# Patient Record
Sex: Male | Born: 1985 | Race: White | Hispanic: No | Marital: Married | Smoking: Never smoker
Health system: Southern US, Community
[De-identification: ages and names within clinical notes are randomized; demographics above are authoritative.]

## PROBLEM LIST (undated history)

## (undated) DIAGNOSIS — K589 Irritable bowel syndrome without diarrhea: Secondary | ICD-10-CM

## (undated) DIAGNOSIS — I10 Essential (primary) hypertension: Secondary | ICD-10-CM

## (undated) HISTORY — DX: Irritable bowel syndrome without diarrhea: K58.9

---
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
## 2017-01-07 HISTORY — PX: COLONOSCOPY: SHX174

## 2018-09-21 ENCOUNTER — Ambulatory Visit: Payer: Managed Care, Other (non HMO) | Admitting: Family Medicine

## 2018-09-21 ENCOUNTER — Other Ambulatory Visit: Payer: Self-pay

## 2018-09-21 ENCOUNTER — Encounter: Payer: Self-pay | Admitting: Family Medicine

## 2018-09-21 VITALS — BP 129/80 | HR 83 | Temp 97.9°F | Resp 18 | Ht 76.5 in | Wt 277.4 lb

## 2018-09-21 DIAGNOSIS — Z0000 Encounter for general adult medical examination without abnormal findings: Secondary | ICD-10-CM | POA: Diagnosis not present

## 2018-09-21 DIAGNOSIS — Z23 Encounter for immunization: Secondary | ICD-10-CM

## 2018-09-21 DIAGNOSIS — E669 Obesity, unspecified: Secondary | ICD-10-CM | POA: Insufficient documentation

## 2018-09-21 DIAGNOSIS — R42 Dizziness and giddiness: Secondary | ICD-10-CM | POA: Insufficient documentation

## 2018-09-21 DIAGNOSIS — Z114 Encounter for screening for human immunodeficiency virus [HIV]: Secondary | ICD-10-CM

## 2018-09-21 DIAGNOSIS — R030 Elevated blood-pressure reading, without diagnosis of hypertension: Secondary | ICD-10-CM | POA: Diagnosis not present

## 2018-09-21 DIAGNOSIS — L749 Eccrine sweat disorder, unspecified: Secondary | ICD-10-CM | POA: Diagnosis not present

## 2018-09-21 NOTE — Progress Notes (Signed)
Patient ID: Erik Anderson, male  DOB: 01-03-1986, 33 y.o.   MRN: [id number] Patient Care Team    Relationship Specialty Notifications Start End  Ma Hillock, DO PCP - General Family Medicine  09/21/18     Chief Complaint  Patient presents with   Establish Care    Pt has had a few episodes recently where pt turned pale, sweating, tachycardic. Went to the NP at work and BP was elevated. 150's/90's. Denies chest pain. Does have frequent headaches. Pt has not seen MD for years and he does not know names. He moved around often.     Subjective:  Erik Anderson is a 33 y.o.  male present for new patient establishment. He desires a CPE and has acute concern.  All past medical history, surgical history, allergies, family history, immunizations, medications and social history were updated in the electronic medical record today. All recent labs, ED visits and hospitalizations within the last year were reviewed.   Elevated BP without diagnosis of hypertension/obesity/Sweating/Dizziness abnormality Patient reports he has had some elevated blood pressures when it is taken at work.  He states he will have episodes where he feels he is pale he starts to sweat and then his heart will start to race.  He was able to take his blood pressure on 1 of these events and its 150/90.  He states he takes his blood pressure on occasions and it is always between 140-150/90.  He states he has an automatic cuff.  He does not have an extra large cuff.  He has a family history of hypertension.  He has never been treated for blood pressure.  He states he does have some anxiety surrounding work but overall he does not feel he has increased anxiety.  There is no family history of arrhythmias.  He states he has had 3-4 events in the past 4 months, all of which have occurred at work.  He routinely skips breakfast and frequently does not eat lunch.  He states these events likely have happened on the days he  have skipped both meals.  He denies any headaches, chest pain, shortness of breath or syncopal episodes.  Health maintenance:  Colonoscopy: No family history of colon cancer.  Routine screening at 72.  Patient reports he had a colonoscopy in 2019 secondary to some IBS symptoms.  He states the colonoscopy was normal and they told him to follow-up for his routine colon cancer screening at 50. Immunizations: Flu Enza vaccine declined today.  Tetanus vaccine due and patient is agreeable to have completed today. Infectious disease screening: Patient agreeable to HIV testing today. PSA: No family history.  No symptoms.  Routine screening.   Body mass index is 33.32 kg/m.  Depression screen [facility] 2/9 09/21/2018  Decreased Interest 0  Down, Depressed, Hopeless 0  PHQ - 2 Score 0   No flowsheet data found.     No flowsheet data found.  Immunization History  Administered Date(s) Administered   Tdap 09/21/2018    No exam data present  Past Medical History:  Diagnosis Date   Hypertension    No Known Allergies History reviewed. No pertinent surgical history. Family History  Problem Relation Age of Onset   Hypertension Mother    Hypertension Brother    Breast cancer Paternal Grandmother    Asthma Paternal Grandfather    Social History   Social History Narrative   Marital status/children/pets: Married   Education/employment: Employed, some college.  Works in  sales.   Safety:      -smoke alarm in the home:Yes     - wears seatbelt: Yes     - Feels safe in their relationships: Yes    Allergies as of 09/21/2018   No Known Allergies     Medication List    as of September 21, 2018 11:59 PM   You have not been prescribed any medications.     All past medical history, surgical history, allergies, family history, immunizations andmedications were updated in the EMR today and reviewed under the history and medication portions of their EMR.    Recent Results (from the past  2160 hour(s))  CBC w/Diff     Status: None   Collection Time: 09/21/18  3:50 PM  Result Value Ref Range   WBC 7.7 4.0 - 10.5 K/uL   RBC 5.03 4.22 - 5.81 Mil/uL   Hemoglobin 14.2 13.0 - 17.0 g/dL   HCT 41.9 39.0 - 52.0 %   MCV 83.3 78.0 - 100.0 fl   MCHC 33.9 30.0 - 36.0 g/dL   RDW 13.3 11.5 - 15.5 %   Platelets 249.0 150.0 - 400.0 K/uL   Neutrophils Relative % 60.1 43.0 - 77.0 %   Lymphocytes Relative 31.1 12.0 - 46.0 %   Monocytes Relative 6.2 3.0 - 12.0 %   Eosinophils Relative 1.2 0.0 - 5.0 %   Basophils Relative 1.4 0.0 - 3.0 %   Neutro Abs 4.6 1.4 - 7.7 K/uL   Lymphs Abs 2.4 0.7 - 4.0 K/uL   Monocytes Absolute 0.5 0.1 - 1.0 K/uL   Eosinophils Absolute 0.1 0.0 - 0.7 K/uL   Basophils Absolute 0.1 0.0 - 0.1 K/uL  Comp Met (CMET)     Status: None   Collection Time: 09/21/18  3:50 PM  Result Value Ref Range   Sodium 139 135 - 145 mEq/L   Potassium 3.9 3.5 - 5.1 mEq/L   Chloride 104 96 - 112 mEq/L   CO2 25 19 - 32 mEq/L   Glucose, Bld 79 70 - 99 mg/dL   BUN 11 6 - 23 mg/dL   Creatinine, Ser 1.04 0.40 - 1.50 mg/dL   Total Bilirubin 0.5 0.2 - 1.2 mg/dL   Alkaline Phosphatase 83 39 - 117 U/L   AST 16 0 - 37 U/L   ALT 19 0 - 53 U/L   Total Protein 6.9 6.0 - 8.3 g/dL   Albumin 4.6 3.5 - 5.2 g/dL   Calcium 9.6 8.4 - 10.5 mg/dL   GFR 81.49 >60.00 mL/min  TSH     Status: None   Collection Time: 09/21/18  3:50 PM  Result Value Ref Range   TSH 1.44 0.35 - 4.50 uIU/mL  Hemoglobin A1c     Status: None   Collection Time: 09/21/18  3:50 PM  Result Value Ref Range   Hgb A1c MFr Bld 5.3 4.6 - 6.5 %    Comment: Glycemic Control Guidelines for People with Diabetes:Non Diabetic:  <6%Goal of Therapy: <7%Additional Action Suggested:  >8%   Lipid panel     Status: Abnormal   Collection Time: 09/21/18  3:50 PM  Result Value Ref Range   Cholesterol 217 (H) 0 - 200 mg/dL    Comment: ATP III Classification       Desirable:  < 200 mg/dL               Borderline High:  200 - 239 mg/dL  High:  > = 240 mg/dL   Triglycerides 205.0 (H) 0.0 - 149.0 mg/dL    Comment: Normal:  <150 mg/dLBorderline High:  150 - 199 mg/dL   HDL 35.80 (L) >39.00 mg/dL   VLDL 41.0 (H) 0.0 - 40.0 mg/dL   Total CHOL/HDL Ratio 6     Comment:                Men          Women1/2 Average Risk     3.4          3.3Average Risk          5.0          4.42X Average Risk          9.6          7.13X Average Risk          15.0          11.0                       NonHDL 181.35     Comment: NOTE:  Non-HDL goal should be 30 mg/dL higher than patient's LDL goal (i.e. LDL goal of < 70 mg/dL, would have non-HDL goal of < 100 mg/dL)  HIV antibody (with reflex)     Status: None   Collection Time: 09/21/18  3:50 PM  Result Value Ref Range   HIV 1&2 Ab, 4th Generation NON-REACTIVE NON-REACTI    Comment: HIV-1 antigen and HIV-1/HIV-2 antibodies were not detected. There is no laboratory evidence of HIV infection. Marland Kitchen PLEASE NOTE: This information has been disclosed to you from records whose confidentiality may be protected by state law.  If your state requires such protection, then the state law prohibits you from making any further disclosure of the information without the specific written consent of the person to whom it pertains, or as otherwise permitted by law. A general authorization for the release of medical or other information is NOT sufficient for this purpose. . For additional information please refer to http://education.questdiagnostics.com/faq/FAQ106 (This link is being provided for informational/ educational purposes only.) . Marland Kitchen The performance of this assay has not been clinically validated in patients less than 5 years old. Marland Kitchen   LDL cholesterol, direct     Status: None   Collection Time: 09/21/18  3:50 PM  Result Value Ref Range   Direct LDL 165.0 mg/dL    Comment: Optimal:  <100 mg/dLNear or Above Optimal:  100-129 mg/dLBorderline High:  130-159 mg/dLHigh:  160-189 mg/dLVery High:  >190 mg/dL      Patient was never admitted.   ROS: 14 pt review of systems performed and negative (unless mentioned in an HPI)  Objective: BP 129/80 (BP Location: Left Arm, Patient Position: Sitting, Cuff Size: Normal)    Pulse 83    Temp 97.9 F (36.6 C) (Temporal)    Resp 18    Ht 6' 4.5" (1.943 m)    Wt 277 lb 6 oz (125.8 kg)    SpO2 99%    BMI 33.32 kg/m  Gen: Afebrile. No acute distress. Nontoxic in appearance, well-developed, well-nourished, pleasant Caucasian obese male. HENT: AT. [place]. Bilateral TM visualized and normal in appearance, normal external auditory canal. MMM, no oral lesions, adequate dentition. Bilateral nares within normal limits. Throat without erythema, ulcerations or exudates.  No cough on exam, no hoarseness on exam. Eyes:Pupils Equal Round Reactive to light, Extraocular movements intact,  Conjunctiva without redness, discharge or  icterus. Neck/lymp/endocrine: Supple, no lymphadenopathy, no thyromegaly CV: RRR no murmur, no edema, +2/4 P posterior tibialis pulses.  Chest: CTAB, no wheeze, rhonchi or crackles.  Normal respiratory effort.  Good air movement. Abd: Soft.  Obese. NTND. BS present.  No masses palpated. No hepatosplenomegaly. No rebound tenderness or guarding. Skin: No rashes, purpura or petechiae. Warm and well-perfused. Skin intact. Neuro/Msk:  Normal gait. PERLA. EOMi. Alert. Oriented x3.  Cranial nerves II through XII intact. Muscle strength 5/5 upper/lower extremity. DTRs equal bilaterally. Psych: Normal affect, dress and demeanor. Normal speech. Normal thought content and judgment.  Assessment/plan: Erik Anderson is a 33 y.o. male present for Est care/CPE and acute concern Elevated BP without diagnosis of hypertension/obesity/Sweating/Dizziness abnormality Blood pressure today is stable.  Discussed with him the potential diagnoses and it seems that he does skip breakfast and lunch many days and the symptoms could be associated with hypoglycemic events.   We will collect CBC, CMP, thyroid, A1c and his lipid panel today.  Advised him to start eating at least a small meal in the morning and have healthy snacks available if skipping meals.   - CBC w/Diff - Comp Met (CMET) - TSH - Hemoglobin A1c - Lipid panel If labs are normal and symptoms resolve with routine meals/not skipping meals no need to follow-up.  If symptoms still occur after ruling out hypoglycemia as cause or he notices elevated blood pressures despite accurate readings (sitting, at least 2 minutes, extra-large cuff) then follow up at that time.  Encounter for screening for HIV - HIV antibody (with reflex) Need for Tdap vaccination - Tdap vaccine greater than or equal to 34yo IM Encounter for preventive health examination Patient was encouraged to exercise greater than 150 minutes a week. Patient was encouraged to choose a diet filled with fresh fruits and vegetables, and lean meats. AVS provided to patient today for education/recommendation on gender specific health and safety maintenance. Colonoscopy: No family history of colon cancer.  Routine screening at 40. Immunizations: Flu Enza vaccine declined today.  Tetanus vaccine due and patient is agreeable to have completed today. Infectious disease screening: Patient agreeable to HIV testing today. PSA: No family history.  No symptoms.  Routine screening.   Return in about 1 year (around 09/21/2019) for CPE (30 min).  Relative annual physical was completed today as well as an additional 10 minutes spent evaluating and discussing acute concern.  Note is dictated utilizing voice recognition software. Although note has been proof read prior to signing, occasional typographical errors still can be missed. If any questions arise, please do not hesitate to call for verification.  Electronically signed by: Howard Pouch, DO [place]

## 2018-09-22 ENCOUNTER — Encounter: Payer: Self-pay | Admitting: Family Medicine

## 2018-09-22 DIAGNOSIS — E785 Hyperlipidemia, unspecified: Secondary | ICD-10-CM | POA: Insufficient documentation

## 2018-09-22 LAB — COMPREHENSIVE METABOLIC PANEL
ALT: 19 U/L (ref 0–53)
AST: 16 U/L (ref 0–37)
Albumin: 4.6 g/dL (ref 3.5–5.2)
Alkaline Phosphatase: 83 U/L (ref 39–117)
BUN: 11 mg/dL (ref 6–23)
CO2: 25 mEq/L (ref 19–32)
Calcium: 9.6 mg/dL (ref 8.4–10.5)
Chloride: 104 mEq/L (ref 96–112)
Creatinine, Ser: 1.04 mg/dL (ref 0.40–1.50)
GFR: 81.49 mL/min (ref >60.00)
Glucose, Bld: 79 mg/dL (ref 70–99)
Potassium: 3.9 mEq/L (ref 3.5–5.1)
Sodium: 139 mEq/L (ref 135–145)
Total Bilirubin: 0.5 mg/dL (ref 0.2–1.2)
Total Protein: 6.9 g/dL (ref 6.0–8.3)

## 2018-09-22 LAB — LIPID PANEL
Cholesterol: 217 mg/dL — ABNORMAL HIGH (ref 0–200)
HDL: 35.8 mg/dL — ABNORMAL LOW (ref >39.00)
NonHDL: 181.35
Total CHOL/HDL Ratio: 6
Triglycerides: 205 mg/dL — ABNORMAL HIGH (ref 0.0–149.0)
VLDL: 41 mg/dL — ABNORMAL HIGH (ref 0.0–40.0)

## 2018-09-22 LAB — CBC WITH DIFFERENTIAL/PLATELET
Basophils Absolute: 0.1 10*3/uL (ref 0.0–0.1)
Basophils Relative: 1.4 % (ref 0.0–3.0)
Eosinophils Absolute: 0.1 10*3/uL (ref 0.0–0.7)
Eosinophils Relative: 1.2 % (ref 0.0–5.0)
HCT: 41.9 % (ref 39.0–52.0)
Hemoglobin: 14.2 g/dL (ref 13.0–17.0)
Lymphocytes Relative: 31.1 % (ref 12.0–46.0)
Lymphs Abs: 2.4 10*3/uL (ref 0.7–4.0)
MCHC: 33.9 g/dL (ref 30.0–36.0)
MCV: 83.3 fl (ref 78.0–100.0)
Monocytes Absolute: 0.5 10*3/uL (ref 0.1–1.0)
Monocytes Relative: 6.2 % (ref 3.0–12.0)
Neutro Abs: 4.6 10*3/uL (ref 1.4–7.7)
Neutrophils Relative %: 60.1 % (ref 43.0–77.0)
Platelets: 249 10*3/uL (ref 150.0–400.0)
RBC: 5.03 Mil/uL (ref 4.22–5.81)
RDW: 13.3 % (ref 11.5–15.5)
WBC: 7.7 10*3/uL (ref 4.0–10.5)

## 2018-09-22 LAB — HIV ANTIBODY (ROUTINE TESTING W REFLEX): HIV 1&2 Ab, 4th Generation: NONREACTIVE

## 2018-09-22 LAB — HEMOGLOBIN A1C: Hgb A1c MFr Bld: 5.3 % (ref 4.6–6.5)

## 2018-09-22 LAB — TSH: TSH: 1.44 u[IU]/mL (ref 0.35–4.50)

## 2018-09-22 LAB — LDL CHOLESTEROL, DIRECT: Direct LDL: 165 mg/dL

## 2018-09-22 NOTE — Patient Instructions (Signed)

## 2019-03-09 ENCOUNTER — Ambulatory Visit: Payer: Managed Care, Other (non HMO) | Admitting: Family Medicine

## 2019-10-26 ENCOUNTER — Telehealth: Payer: Self-pay

## 2019-10-26 NOTE — Telephone Encounter (Signed)
Pt scheduled with PCP on 11/01/19.  Attempted to contact patient to discuss symptoms, no answer/VM.   [facility] Day - Client TELEPHONE ADVICE RECORD AccessNurse Patient Name: Erik Anderson Gender: Male DOB: April 26, 1985 Age: 34 Y 7 M 16 D Return Phone Number: [phone] (Primary) Address: City/State/Zip: [place] [address] Client [facility] Day - Client Client Site  [facility] [place] - Day Physician AA - PHYSICIAN, Erik Anderson- MD Contact Type Call Who Is Calling Patient / Member / Family / Caregiver Call Type Triage / Clinical Relationship To Patient Self Return Phone Number [phone] (Primary) Chief Complaint Flank Pain Reason for Call Symptomatic / Request for Health Information Initial Comment Past two nights been having pain up behind his ribs going from the front around to his back making him vomit. Thinks it is his gall bladder GOTO Facility Not Barbaraann Rondo priority care Translation No Nurse Assessment Nurse: Erik Gelinas, RN, Regulatory affairs officer (Eastern Time): 10/25/2019 3:03:25 PM Confirm and document reason for call. If symptomatic, describe symptoms. ---Past two nights pt has been having side pain from abdomen around to his back with vomiting. had fever saturday and last night around 101. Does the patient have any new or worsening symptoms? ---Yes Will a triage be completed? ---Yes Related visit to physician within the last 2 weeks? ---No Does the PT have any chronic conditions? (i.e. diabetes, asthma, this includes High risk factors for pregnancy, etc.) ---No Is this a behavioral health or substance abuse call? ---No Guidelines Guideline Title Affirmed Question Affirmed Notes Nurse Date/Time (Eastern Time) Flank Pain Patient sounds very sick or weak to the triager Erik Gelinas, RN, [facility] 10/25/2019 3:02:34 PM Disp. Time Erik Anderson Time) Disposition Final User 10/25/2019 3:08:38 PM Go to ED Now (or PCP triage)  Yes Erik Gelinas, RN, Amber Caller Disagree/Comply Comply Caller Understands Yes PreDisposition Did not know what to do PLEASE NOTE: All timestamps contained within this report are represented as [place] Standard Time. CONFIDENTIALTY NOTICE: This fax transmission is intended only for the addressee. It contains information that is legally privileged, confidential or otherwise protected from use or disclosure. If you are not the intended recipient, you are strictly prohibited from reviewing, disclosing, copying using or disseminating any of this information or taking any action in reliance on or regarding this information. If you have received this fax in error, please notify us immediately by telephone so that we can arrange for its return to [place]. Phone: [phone], Toll-Free: [phone], Fax: [phone] Page: 2 of 2 Call Id: [id number] Care Advice Given Per Guideline GO TO ED NOW (OR PCP TRIAGE): ANOTHER ADULT SHOULD DRIVE: * It is better and safer if another adult drives instead of you. CARE ADVICE given per Flank Pain (Adult) guideline. Comments User: Erik Simas, RN Date/Time Erik Anderson Time): 10/25/2019 3:06:36 PM seems to only happen at night. User: Erik Simas, RN Date/Time Erik Anderson Time): 10/25/2019 3:07:16 PM pt not having vomiting today Referrals GO TO FACILITY OTHER - SPECIFY

## 2019-11-01 ENCOUNTER — Encounter: Payer: Self-pay | Admitting: Family Medicine

## 2019-11-01 ENCOUNTER — Ambulatory Visit (INDEPENDENT_AMBULATORY_CARE_PROVIDER_SITE_OTHER): Payer: 59 | Admitting: Family Medicine

## 2019-11-01 ENCOUNTER — Other Ambulatory Visit: Payer: Self-pay

## 2019-11-01 VITALS — BP 138/76 | HR 68 | Temp 98.9°F | Ht 76.5 in | Wt 279.0 lb

## 2019-11-01 DIAGNOSIS — Z23 Encounter for immunization: Secondary | ICD-10-CM | POA: Diagnosis not present

## 2019-11-01 DIAGNOSIS — R1011 Right upper quadrant pain: Secondary | ICD-10-CM | POA: Diagnosis not present

## 2019-11-01 LAB — LIPASE: Lipase: 22 U/L (ref 11.0–59.0)

## 2019-11-01 LAB — COMPREHENSIVE METABOLIC PANEL
ALT: 25 U/L (ref 0–53)
AST: 25 U/L (ref 0–37)
Albumin: 4.5 g/dL (ref 3.5–5.2)
Alkaline Phosphatase: 85 U/L (ref 39–117)
BUN: 13 mg/dL (ref 6–23)
CO2: 28 mEq/L (ref 19–32)
Calcium: 9.3 mg/dL (ref 8.4–10.5)
Chloride: 101 mEq/L (ref 96–112)
Creatinine, Ser: 1.06 mg/dL (ref 0.40–1.50)
GFR: 90.84 mL/min (ref >60.00)
Glucose, Bld: 82 mg/dL (ref 70–99)
Potassium: 3.9 mEq/L (ref 3.5–5.1)
Sodium: 138 mEq/L (ref 135–145)
Total Bilirubin: 0.9 mg/dL (ref 0.2–1.2)
Total Protein: 6.6 g/dL (ref 6.0–8.3)

## 2019-11-01 LAB — CBC
HCT: 41.9 % (ref 39.0–52.0)
Hemoglobin: 14.3 g/dL (ref 13.0–17.0)
MCHC: 34.1 g/dL (ref 30.0–36.0)
MCV: 83.8 fl (ref 78.0–100.0)
Platelets: 217 10*3/uL (ref 150.0–400.0)
RBC: 4.99 Mil/uL (ref 4.22–5.81)
RDW: 13.4 % (ref 11.5–15.5)
WBC: 6.3 10*3/uL (ref 4.0–10.5)

## 2019-11-01 LAB — C-REACTIVE PROTEIN: CRP: <1 mg/dL (ref 0.5–20.0)

## 2019-11-01 NOTE — Progress Notes (Signed)
This visit occurred during the SARS-CoV-2 public health emergency.  Safety protocols were in place, including screening questions prior to the visit, additional usage of staff PPE, and extensive cleaning of exam room while observing appropriate contact time as indicated for disinfecting solutions.    Erik Anderson , May 09, 1985, 34 y.o., male MRN: [id number] Patient Care Team    Relationship Specialty Notifications Start End  Ma Hillock, DO PCP - General Family Medicine  09/21/18     Chief Complaint  Patient presents with  . Abdominal Pain    pt c/o RUQ abd pain that radiates to back for 3 years intermittently but worsen 10 days ago; pt has vomit in past but not recently;      Subjective: Pt presents for an OV with complaints of pain he associated with his gallbladder.  He state he has had RUQ pain intermittently over the last 3 years than can be associated with vomit when it occurs. 10/24/2019 his symptoms became worse. He reports carrying a low-grade fever of 101F, severe pain that radiated from the front to his back with vomiting. This occurrence lasted for approximately 4 hours and he reports he felt like he was "dying. "He reports this is the longest his pain has ever lasted. He then had a smaller but reoccurrence of a gallbladder attack 2 days later. He states his mother had to have her gallbladder removed. He does not feel it is associated with any types of meals or meals at all. He reports the attacks can be very random. Pt has a h/o of IBS. He has had a colonoscopy 2019 for IBS symptoms and reported normal.   Depression screen PHQ 2/9 09/21/2018  Decreased Interest 0  Down, Depressed, Hopeless 0  PHQ - 2 Score 0    No Known Allergies Social History   Social History Narrative   Marital status/children/pets: Married   Education/employment: Employed, some college.  Works in Press [employer].   Safety:      -smoke alarm in the home:Yes     - wears seatbelt: Yes     -  Feels safe in their relationships: Yes   Past Medical History:  Diagnosis Date  . Hypertension   . IBS (irritable bowel syndrome)    Colonoscopy completed 2019 for IBS symptoms   Past Surgical History:  Procedure Laterality Date  . COLONOSCOPY  2019   Patient reports normal.  Completed for IBS symptoms.   Family History  Problem Relation Age of Onset  . Hypertension Mother   . Hypertension Brother   . Breast cancer Paternal Grandmother   . Asthma Paternal Grandfather    Allergies as of 11/01/2019   No Known Allergies     Medication List    as of November 01, 2019 11:37 AM   You have not been prescribed any medications.     All past medical history, surgical history, allergies, family history, immunizations andmedications were updated in the EMR today and reviewed under the history and medication portions of their EMR.     ROS: Negative, with the exception of above mentioned in HPI   Objective:  BP 138/76   Pulse 68   Temp 98.9 F (37.2 C) (Oral)   Ht 6' 4.5" (1.943 m)   Wt 279 lb (126.6 kg)   SpO2 100%   BMI 33.52 kg/m  Body mass index is 33.52 kg/m. Gen: Afebrile. No acute distress. Nontoxic in appearance, well developed, well nourished.  HENT: AT. [place].  Eyes:Pupils Equal Round Reactive to light, Extraocular movements intact,  Conjunctiva without redness, discharge or icterus. CV: RRR  Chest: CTAB, no wheeze or crackles. Good air movement, normal resp effort.  Abd: Soft. Obese. NTND. BS present. No masses palpated. No rebound or guarding. Negative Murphy's. Skin: No rashes, purpura or petechiae.  Neuro:  Normal gait. PERLA. EOMi. Alert. Oriented x3  Psych: Normal affect, dress and demeanor. Normal speech. Normal thought content and judgment.  No exam data present No results found. No results found for this or any previous visit (from the past 24 hour(s)).  Assessment/Plan: Tyronn Golda is a 34 y.o. male present for OV for  Right upper quadrant  abdominal pain Patient's HPI certainly sounds consistent with right upper quadrant pain secondary to gallbladder dysfunction. We discussed cholecystitis versus choledocholithiasis. His attacks seem to be more random in nature. We will start work-up today including ultrasound. - CBC - Comp Met (CMET) - C-reactive protein - Lipase - US Abdomen Complete; Future If ultrasound shows evidence of gallstones would refer to surgery. If gallstones are present we will refer to gastroenterology for further evaluation of gallbladder function.  Need for influenza vaccination Influenza vaccination provided today.   Reviewed expectations re: course of current medical issues.  Discussed self-management of symptoms.  Outlined signs and symptoms indicating need for more acute intervention.  Patient verbalized understanding and all questions were answered.  Patient received an After-Visit Summary.    Orders Placed This Encounter  Procedures  . Flu Vaccine QUAD 6+ mos PF IM (Fluarix Quad PF)   No orders of the defined types were placed in this encounter.  Referral Orders  No referral(s) requested today     Note is dictated utilizing voice recognition software. Although note has been proof read prior to signing, occasional typographical errors still can be missed. If any questions arise, please do not hesitate to call for verification.   electronically signed by:  Howard Pouch, DO  [place]

## 2019-11-01 NOTE — Patient Instructions (Signed)
Cholecystitis  Cholecystitis is irritation and swelling (inflammation) of the gallbladder. The gallbladder is an organ that is shaped like a pear. It is under the liver on the right side of the body. This organ stores bile. Bile helps the body break down (digest) the fats in food. This condition can occur all of a sudden. It needs to be treated. What are the causes? This condition may be caused by stones or lumps that form in the gallbladder (gallstones). Gallstones can block the tube (duct) that carries bile out of your gallbladder. Other causes are:  Damage to the gallbladder due to less blood flow.  Germs in the bile ducts.  Scars or kinks in the bile ducts.  Abnormal growths (tumors) in the liver, pancreas, or gallbladder. What increases the risk? You are more likely to develop this condition if:  You have sickle cell disease.  You take birth control pills.  You use estrogen.  You have alcoholic liver disease.  You have liver cirrhosis.  You are being fed through a vein.  You are very ill.  You do not eat or drink for a long time. This is also called "fasting."  You are overweight (obese).  You lose weight too fast.  You are pregnant.  You have high levels of fat in the blood (triglycerides).  You have irritation and swelling of the pancreas (pancreatitis). What are the signs or symptoms? Symptoms of this condition include:  Pain in the belly (abdomen). Pain is often in the upper right area of the belly.  Tenderness or bloating in the belly.  Feeling sick to your stomach (nauseous).  Throwing up (vomiting).  Fever.  Chills. How is this diagnosed? This condition may be diagnosed with a medical history and exam. You may also have other tests, such as:  Imaging tests. This may include: ? Ultrasound. ? CT scan of the belly. ? Nuclear scan. This is also called a HIDA scan. This scan lets your doctor see the bile as it moves in your body. ? MRI.  Blood  tests. These are done to check: ? Your blood count. The white blood cell count may be higher than normal. ? How well your liver works. How is this treated? This condition may be treated with:  Surgery to take out your gallbladder.  Antibiotic medicines to treat illnesses caused by germs.  Going without food for some time.  Giving fluids through an IV tube.  Medicines to treat pain or throwing up. Follow these instructions at home:  If you had surgery, follow instructions from your doctor about how to care for yourself after you go home. Medicines   Take over-the-counter and prescription medicines only as told by your doctor.  If you were prescribed an antibiotic medicine, take it as told by your doctor. Do not stop taking it even if you start to feel better. General instructions  Follow instructions from your doctor about what to eat or drink. Do not eat or drink anything that makes you sick again.  Do not lift anything that is heavier than 10 lb (4.5 kg) until your doctor says that it is safe.  Do not use any products that contain nicotine or tobacco, such as cigarettes and e-cigarettes. If you need help quitting, ask your doctor.  Keep all follow-up visits as told by your doctor. This is important. Contact a doctor if:  You have pain and your medicine does not help.  You have a fever. Get help right away if:  Your pain moves to: ? Another part of your belly. ? Your back.  Your symptoms do not go away.  You have new symptoms. Summary  Cholecystitis is swelling and irritation of the gallbladder.  This condition may be caused by stones or lumps that form in the gallbladder (gallstones).  Common symptoms are pain in the belly. You may feel sick to your stomach and start throwing up. You may also have a fever and chills.  This condition may be treated with surgery to take out the gallbladder. It may also be treated with medicines, fasting, and fluids through an IV  tube.  Follow what you are told about eating and drinking. Do not eat things that make you sick again. This information is not intended to replace advice given to you by your health care provider. Make sure you discuss any questions you have with your health care provider. Document Revised: 05/02/2017 Document Reviewed: 05/02/2017 Elsevier Patient Education  2020 [employer].   Cholelithiasis  Cholelithiasis is also called "gallstones." It is a kind of gallbladder disease. The gallbladder is an organ that stores a liquid (bile) that helps you digest fat. Gallstones may not cause symptoms (may be silent gallstones) until they cause a blockage, and then they can cause pain (gallbladder attack). Follow these instructions at home:  Take over-the-counter and prescription medicines only as told by your doctor.  Stay at a healthy weight.  Eat healthy foods. This includes: ? Eating fewer fatty foods, like fried foods. ? Eating fewer refined carbs (refined carbohydrates). Refined carbs are breads and grains that are highly processed, like white bread and white rice. Instead, choose whole grains like whole-wheat bread and brown rice. ? Eating more fiber. Almonds, fresh fruit, and beans are healthy sources of fiber.  Keep all follow-up visits as told by your doctor. This is important. Contact a doctor if:  You have sudden pain in the upper right side of your belly (abdomen). Pain might spread to your right shoulder or your chest. This may be a sign of a gallbladder attack.  You feel sick to your stomach (are nauseous).  You throw up (vomit).  You have been diagnosed with gallstones that have no symptoms and you get: ? Belly pain. ? Discomfort, burning, or fullness in the upper part of your belly (indigestion). Get help right away if:  You have sudden pain in the upper right side of your belly, and it lasts for more than 2 hours.  You have belly pain that lasts for more than 5  hours.  You have a fever or chills.  You keep feeling sick to your stomach or you keep throwing up.  Your skin or the whites of your eyes turn yellow (jaundice).  You have dark-colored pee (urine).  You have light-colored poop (stool). Summary  Cholelithiasis is also called "gallstones."  The gallbladder is an organ that stores a liquid (bile) that helps you digest fat.  Silent gallstones are gallstones that do not cause symptoms.  A gallbladder attack may cause sudden pain in the upper right side of your belly. Pain might spread to your right shoulder or your chest. If this happens, contact your doctor.  If you have sudden pain in the upper right side of your belly that lasts for more than 2 hours, get help right away. This information is not intended to replace advice given to you by your health care provider. Make sure you discuss any questions you have with your health care provider.  Document Revised: 12/06/2016 Document Reviewed: 09/10/2015 Elsevier Patient Education  2020 [employer].

## 2019-11-06 ENCOUNTER — Ambulatory Visit (HOSPITAL_BASED_OUTPATIENT_CLINIC_OR_DEPARTMENT_OTHER)
Admission: RE | Admit: 2019-11-06 | Discharge: 2019-11-06 | Disposition: A | Discharge status: Home / Self Care | Payer: 59 | Source: Ambulatory Visit | Attending: Family Medicine | Admitting: Family Medicine

## 2019-11-06 ENCOUNTER — Other Ambulatory Visit: Payer: Self-pay

## 2019-11-06 DIAGNOSIS — R1011 Right upper quadrant pain: Secondary | ICD-10-CM | POA: Diagnosis not present

## 2019-11-08 ENCOUNTER — Telehealth: Payer: Self-pay | Admitting: Family Medicine

## 2019-11-08 DIAGNOSIS — K8010 Calculus of gallbladder with chronic cholecystitis without obstruction: Secondary | ICD-10-CM

## 2019-11-08 NOTE — Telephone Encounter (Signed)
Spoke with pt regarding labs and instructions.   

## 2019-11-08 NOTE — Telephone Encounter (Signed)
LVM for pt to CB regarding results.  

## 2019-11-08 NOTE — Telephone Encounter (Signed)
Patient returned call for results 

## 2019-11-08 NOTE — Telephone Encounter (Signed)
Please call patient: His ultrasound resulted with multiple large stones within the gallbladder. I have referred him to general surgery to discuss removal of gallbladder.  They will be calling him to get him scheduled for consultation. In the meantime, if he has another gallbladder attack that lasts greater than 1 hour he should report to the emergency room.

## 2019-11-18 ENCOUNTER — Ambulatory Visit: Payer: Self-pay | Admitting: General Surgery

## 2019-11-18 NOTE — H&P (Signed)
Raliegh Ip Appointment: 11/18/2019 9:00 AM Location: [facility] Patient #: [id number] DOB: 1985/09/06 Married / Language: [place] / Race: White Male  History of Present Illness Minerva Areola M. Neale Marzette MD; 11/18/2019 9:24 AM) The patient is a 34 year old male who presents for evaluation of gall stones. he is referred by Dr Claiborne Billings for evaluation of gallbladder pounds. He reports a 3-4 year history of right upper quadrant and right-sided pain. The episodes generally last 2-3 hours. He would describe them as achy. Occasionally it would radiate to his back. There is no particular food that would trigger an event. He had a severe attack about one month ago that he would rate of 9 out of 10. It lasted longer than usual. He had nausea and vomiting with it. It happened 2 days in a row. So he saw a primary care physician. His labs were unremarkable but an ultrasound did reveal multiple large stones in the gallbladder and a mildly thickened gallbladder wall. Common bile duct was normal. Otherwise healthy. No prior surgery. No tobacco use. No chest pain, chest pressure, source of breath, dyspnea on exertion. No prior blood clot   Problem List/Past Medical Minerva Areola M. Andrey Campanile, MD; 11/18/2019 9:24 AM) SYMPTOMATIC CHOLELITHIASIS (K80.20)  Allergies Rosezella Florida, RN; 11/18/2019 8:47 AM) No Known Drug Allergies [11/18/2019]: Allergies Reconciled  Medication History Rosezella [place], RN; 11/18/2019 8:47 AM) No Current Medications Medications Reconciled  Social History Rosezella [place], RN; 11/18/2019 8:46 AM) No drug use  Family History Rosezella [place], RN; 11/18/2019 8:46 AM) First Degree Relatives No pertinent family history     Review of Systems Minerva Areola M. Sharalee Witman MD; 11/18/2019 9:22 AM) Cardiovascular Not Present- Chest Pain, Difficulty Breathing Lying Down, Leg Cramps, Palpitations, Rapid Heart Rate, Shortness of Breath and Swelling of Extremities. Male Genitourinary Not  Present- Blood in Urine, Change in Urinary Stream, Frequency, Impotence, Nocturia, Painful Urination, Urgency and Urine Leakage. Neurological Not Present- Decreased Memory, Fainting, Headaches, Numbness, Seizures, Tingling, Tremor, Trouble walking and Weakness. All other systems negative  Vitals (Diane Herrin RN; 11/18/2019 8:47 AM) 11/18/2019 8:47 AM Weight: 278.5 lb Height: 76in Body Surface Area: 2.55 m Body Mass Index: 33.9 kg/m  Temp.: 98.38F  Pulse: 97 (Regular)  P.OX: 97% (Room air) BP: 148/76(Sitting, Left Arm, Standard)        Physical Exam Minerva Areola M. Bryleigh Ottaway MD; 11/18/2019 9:22 AM)  General Mental Status-Alert. General Appearance-Consistent with stated age. Hydration-Well hydrated. Voice-Normal.  Head and Neck Head-normocephalic, atraumatic with no lesions or palpable masses. Trachea-midline. Thyroid Gland Characteristics - normal size and consistency.  Eye Eyeball - Bilateral-Extraocular movements intact. Sclera/Conjunctiva - Bilateral-No scleral icterus.  Chest and Lung Exam Chest and lung exam reveals -quiet, even and easy respiratory effort with no use of accessory muscles and on auscultation, normal breath sounds, no adventitious sounds and normal vocal resonance. Inspection Chest Wall - Normal. Back - normal.  Breast - Did not examine.  Cardiovascular Cardiovascular examination reveals -normal heart sounds, regular rate and rhythm with no murmurs and normal pedal pulses bilaterally.  Abdomen Inspection  Inspection of the abdomen reveals: Note: small umbilical fascial defect. Skin - Scar - no surgical scars. Palpation/Percussion Palpation and Percussion of the abdomen reveal - Soft, Non Tender, No Rebound tenderness, No Rigidity (guarding) and No hepatosplenomegaly. Auscultation Auscultation of the abdomen reveals - Bowel sounds normal.  Peripheral Vascular Upper Extremity Palpation - Pulses bilaterally  normal.  Neurologic Neurologic evaluation reveals -alert and oriented x 3 with no impairment of recent or remote memory. Mental Status-Normal.  Neuropsychiatric The patient's mood and affect are described as -normal. Judgment and Insight-insight is appropriate concerning matters relevant to self.  Musculoskeletal Normal Exam - Left-Upper Extremity Strength Normal and Lower Extremity Strength Normal. Normal Exam - Right-Upper Extremity Strength Normal and Lower Extremity Strength Normal.  Lymphatic Head & Neck  General Head & Neck Lymphatics: Bilateral - Description - Normal. Axillary - Did not examine. Femoral & Inguinal - Did not examine.    Assessment & Plan Minerva Areola M. Theodus Ran MD; 11/18/2019 9:22 AM)  SYMPTOMATIC CHOLELITHIASIS (K80.20) Impression: I believe the patient's symptoms are consistent with gallbladder disease.  We discussed gallbladder disease. The patient was given Agricultural engineer. We discussed non-operative and operative management. We discussed the signs & symptoms of acute cholecystitis  I discussed laparoscopic cholecystectomy with IOC in detail. The patient was given educational material as well as diagrams detailing the procedure. We discussed the risks and benefits of a laparoscopic cholecystectomy including, but not limited to bleeding, infection, injury to surrounding structures such as the intestine or liver, bile leak, retained gallstones, need to convert to an open procedure, prolonged diarrhea, blood clots such as DVT, common bile duct injury, anesthesia risks, and possible need for additional procedures. We discussed the typical post-operative recovery course. I explained that the likelihood of improvement of their symptoms is good.  The patient has elected to proceed with surgery.  This patient encounter took 30 minutes today to perform the following: take history, perform exam, review outside records, interpret imaging, counsel the patient  on their diagnosis and document encounter, findings & plan in the EHR  Current Plans Pt Education - Pamphlet Given - Laparoscopic Gallbladder Surgery: discussed with patient and provided information. You are being scheduled for surgery- Our schedulers will call you.  You should hear from our office's scheduling department within 5 working days about the location, date, and time of surgery. We try to make accommodations for patient's preferences in scheduling surgery, but sometimes the OR schedule or the surgeon's schedule prevents [place] from making those accommodations.  If you have not heard from our office [phone]) in 5 working days, call the office and ask for your surgeon's nurse.  If you have other questions about your diagnosis, plan, or surgery, call the office and ask for your surgeon's nurse.  Mary Sella. Andrey Campanile, MD, FACS General, Bariatric, & Minimally Invasive Surgery [facility], [place]

## 2020-01-11 ENCOUNTER — Encounter (HOSPITAL_COMMUNITY): Payer: Self-pay

## 2020-01-11 ENCOUNTER — Other Ambulatory Visit: Payer: Self-pay

## 2020-01-11 NOTE — Progress Notes (Signed)
DUE TO COVID-19 ONLY ONE VISITOR IS ALLOWED TO COME WITH YOU AND STAY IN THE WAITING ROOM ONLY DURING PRE OP AND PROCEDURE DAY OF SURGERY. THE 1 VISITOR  MAY VISIT WITH YOU AFTER SURGERY IN YOUR PRIVATE ROOM DURING VISITING HOURS ONLY!  YOU NEED TO HAVE A COVID 19 TEST ON_ 01/17/2020 ______ @_  3pm ______, THIS TEST MUST BE DONE BEFORE SURGERY,  COVID TESTING SITE [address] [place] , IT IS ON THE RIGHT GOING OUT [address] APPROXIMATELY  2 MINUTES PAST [employer] ON THE RIGHT. ONCE YOUR COVID TEST IS COMPLETED,  PLEASE BEGIN THE QUARANTINE INSTRUCTIONS AS OUTLINED IN YOUR HANDOUT.                Erik Anderson [facility]  01/11/2020   Your procedure is scheduled on: 01/20/2020    Report to [facility] Main  Entrance   Report to admitting at   0530 AM     Call this number if you have problems the morning of surgery [phone]    REMEMBER: NO  SOLID FOOD CANDY OR GUM AFTER MIDNIGHT. CLEAR LIQUIDS UNTIL   0430am       . NOTHING BY MOUTH EXCEPT CLEAR LIQUIDS UNTIL    . PLEASE FINISH ENSURE DRINK PER SURGEON ORDER  WHICH NEEDS TO BE COMPLETED AT    0430am   .      CLEAR LIQUID DIET   Foods Allowed                                                                    Coffee and tea, regular and decaf                            Fruit ices (not with fruit pulp)                                      Iced Popsicles                                    Carbonated beverages, regular and diet                                    Cranberry, grape and apple juices Sports drinks like Gatorade Lightly seasoned clear broth or consume(fat free) Sugar, honey syrup ___________________________________________________________________      BRUSH YOUR TEETH MORNING OF SURGERY AND RINSE YOUR MOUTH OUT, NO CHEWING GUM CANDY OR MINTS.     Take these medicines the morning of surgery with A SIP OF WATER: none  None  DO NOT TAKE ANY DIABETIC MEDICATIONS DAY OF YOUR  SURGERY                               You may not have any metal on your body including hair pins and              piercings  Do not wear jewelry, make-up, lotions, powders or perfumes, deodorant             Do not wear nail polish on your fingernails.  Do not shave  48 hours prior to surgery.              Men may shave face and neck.   Do not bring valuables to the hospital. [place].  Contacts, dentures or bridgework may not be worn into surgery.  Leave suitcase in the car. After surgery it may be brought to your room.     Patients discharged the day of surgery will not be allowed to drive home. IF YOU ARE HAVING SURGERY AND GOING HOME THE SAME DAY, YOU MUST HAVE AN ADULT TO DRIVE YOU HOME AND BE WITH YOU FOR 24 HOURS. YOU MAY GO HOME BY TAXI OR UBER OR ORTHERWISE, BUT AN ADULT MUST ACCOMPANY YOU HOME AND STAY WITH YOU FOR 24 HOURS.  Name and phone number of your driver:  Special Instructions: N/A              Please read over the following fact sheets you were given: _____________________________________________________________________  [facility] - Preparing for Surgery Before surgery, you can play an important role.  Because skin is not sterile, your skin needs to be as free of germs as possible.  You can reduce the number of germs on your skin by washing with CHG (chlorahexidine gluconate) soap before surgery.  CHG is an antiseptic cleaner which kills germs and bonds with the skin to continue killing germs even after washing. Please DO NOT use if you have an allergy to CHG or antibacterial soaps.  If your skin becomes reddened/irritated stop using the CHG and inform your nurse when you arrive at Short Stay. Do not shave (including legs and underarms) for at least 48 hours prior to the first CHG shower.  You may shave your face/neck. Please follow these instructions carefully:  1.  Shower with CHG Soap the night before surgery and the   morning of Surgery.  2.  If you choose to wash your hair, wash your hair first as usual with your  normal  shampoo.  3.  After you shampoo, rinse your hair and body thoroughly to remove the  shampoo.                           4.  Use CHG as you would any other liquid soap.  You can apply chg directly  to the skin and wash                       Gently with a scrungie or clean washcloth.  5.  Apply the CHG Soap to your body ONLY FROM THE NECK DOWN.   Do not use on face/ open                           Wound or open sores. Avoid contact with eyes, ears mouth and genitals (private parts).                       Wash face,  Genitals (private parts) with your normal soap.             6.  Wash  thoroughly, paying special attention to the area where your surgery  will be performed.  7.  Thoroughly rinse your body with warm water from the neck down.  8.  DO NOT shower/wash with your normal soap after using and rinsing off  the CHG Soap.                9.  Pat yourself dry with a clean towel.            10.  Wear clean pajamas.            11.  Place clean sheets on your bed the night of your first shower and do not  sleep with pets. Day of Surgery : Do not apply any lotions/deodorants the morning of surgery.  Please wear clean clothes to the hospital/surgery center.  FAILURE TO FOLLOW THESE INSTRUCTIONS MAY RESULT IN THE CANCELLATION OF YOUR SURGERY PATIENT SIGNATURE_________________________________  NURSE SIGNATURE__________________________________  ________________________________________________________________________

## 2020-01-12 ENCOUNTER — Encounter (HOSPITAL_COMMUNITY)
Admission: RE | Admit: 2020-01-12 | Discharge: 2020-01-12 | Disposition: A | Discharge status: Home / Self Care | Payer: 59 | Source: Ambulatory Visit | Attending: General Surgery | Admitting: General Surgery

## 2020-01-12 DIAGNOSIS — Z01812 Encounter for preprocedural laboratory examination: Secondary | ICD-10-CM | POA: Insufficient documentation

## 2020-01-12 LAB — CBC
HCT: 44.1 % (ref 39.0–52.0)
Hemoglobin: 15.3 g/dL (ref 13.0–17.0)
MCH: 29 pg (ref 26.0–34.0)
MCHC: 34.7 g/dL (ref 30.0–36.0)
MCV: 83.5 fL (ref 80.0–100.0)
Platelets: 213 10*3/uL (ref 150–400)
RBC: 5.28 MIL/uL (ref 4.22–5.81)
RDW: 12.7 % (ref 11.5–15.5)
WBC: 7.2 10*3/uL (ref 4.0–10.5)
nRBC: 0 % (ref 0.0–0.2)

## 2020-01-14 ENCOUNTER — Encounter (HOSPITAL_COMMUNITY)
Admission: RE | Admit: 2020-01-14 | Discharge: 2020-01-14 | Disposition: A | Discharge status: Home / Self Care | Payer: 59 | Source: Ambulatory Visit | Attending: Family Medicine | Admitting: Family Medicine

## 2020-01-17 ENCOUNTER — Other Ambulatory Visit (HOSPITAL_COMMUNITY)
Admission: RE | Admit: 2020-01-17 | Discharge: 2020-01-17 | Disposition: A | Discharge status: Home / Self Care | Payer: 59 | Source: Ambulatory Visit | Attending: General Surgery | Admitting: General Surgery

## 2020-01-17 DIAGNOSIS — Z20822 Contact with and (suspected) exposure to covid-19: Secondary | ICD-10-CM | POA: Diagnosis not present

## 2020-01-17 DIAGNOSIS — Z01812 Encounter for preprocedural laboratory examination: Secondary | ICD-10-CM | POA: Diagnosis present

## 2020-01-18 LAB — SARS CORONAVIRUS 2 (TAT 6-24 HRS): SARS Coronavirus 2: NEGATIVE

## 2020-01-19 ENCOUNTER — Encounter (HOSPITAL_COMMUNITY): Payer: Self-pay | Admitting: General Surgery

## 2020-01-20 ENCOUNTER — Ambulatory Visit (HOSPITAL_COMMUNITY)
Admission: RE | Admit: 2020-01-20 | Discharge: 2020-01-20 | Disposition: A | Discharge status: Home / Self Care | Payer: 59 | Source: Ambulatory Visit | Attending: General Surgery | Admitting: General Surgery

## 2020-01-20 ENCOUNTER — Encounter (HOSPITAL_COMMUNITY)
Admission: RE | Disposition: A | Discharge status: Home / Self Care | Payer: Self-pay | Source: Ambulatory Visit | Attending: General Surgery

## 2020-01-20 ENCOUNTER — Ambulatory Visit (HOSPITAL_COMMUNITY): Payer: 59 | Admitting: Anesthesiology

## 2020-01-20 ENCOUNTER — Ambulatory Visit (HOSPITAL_COMMUNITY): Payer: 59

## 2020-01-20 ENCOUNTER — Encounter (HOSPITAL_COMMUNITY): Payer: Self-pay | Admitting: General Surgery

## 2020-01-20 DIAGNOSIS — K8010 Calculus of gallbladder with chronic cholecystitis without obstruction: Secondary | ICD-10-CM | POA: Insufficient documentation

## 2020-01-20 DIAGNOSIS — Z419 Encounter for procedure for purposes other than remedying health state, unspecified: Secondary | ICD-10-CM

## 2020-01-20 DIAGNOSIS — K8020 Calculus of gallbladder without cholecystitis without obstruction: Secondary | ICD-10-CM | POA: Diagnosis present

## 2020-01-20 HISTORY — PX: CHOLECYSTECTOMY: SHX55

## 2020-01-20 SURGERY — LAPAROSCOPIC CHOLECYSTECTOMY WITH INTRAOPERATIVE CHOLANGIOGRAM
Anesthesia: General

## 2020-01-20 MED ORDER — OXYCODONE HCL 5 MG PO TABS
ORAL_TABLET | ORAL | Status: AC
  Filled 2020-01-20: qty 1

## 2020-01-20 MED ORDER — BUPIVACAINE-EPINEPHRINE 0.25% -1:200000 IJ SOLN
INTRAMUSCULAR | Status: DC | PRN
  Administered 2020-01-20: 30 mL

## 2020-01-20 MED ORDER — LIDOCAINE HCL (PF) 2 % IJ SOLN
INTRAMUSCULAR | Status: AC
  Filled 2020-01-20: qty 10

## 2020-01-20 MED ORDER — DEXAMETHASONE SODIUM PHOSPHATE 10 MG/ML IJ SOLN
INTRAMUSCULAR | Status: AC
  Filled 2020-01-20: qty 2

## 2020-01-20 MED ORDER — DEXAMETHASONE SODIUM PHOSPHATE 10 MG/ML IJ SOLN
INTRAMUSCULAR | Status: AC
  Filled 2020-01-20: qty 1

## 2020-01-20 MED ORDER — ROCURONIUM BROMIDE 10 MG/ML (PF) SYRINGE
PREFILLED_SYRINGE | INTRAVENOUS | Status: DC | PRN
  Administered 2020-01-20: 100 mg via INTRAVENOUS
  Administered 2020-01-20 (×2): 10 mg via INTRAVENOUS

## 2020-01-20 MED ORDER — ACETAMINOPHEN 500 MG PO TABS: 1000.0000 mg | ORAL_TABLET | Freq: Three times a day (TID) | ORAL | 0 refills | Status: AC

## 2020-01-20 MED ORDER — OXYCODONE HCL 5 MG PO TABS
5.0000 mg | ORAL_TABLET | Freq: Once | ORAL | Status: AC | PRN
  Administered 2020-01-20: 5 mg via ORAL

## 2020-01-20 MED ORDER — SODIUM CHLORIDE 0.9 % IV SOLN
2.0000 g | INTRAVENOUS | Status: AC
  Administered 2020-01-20: 2 g via INTRAVENOUS
  Filled 2020-01-20: qty 2

## 2020-01-20 MED ORDER — ONDANSETRON HCL 4 MG/2ML IJ SOLN
INTRAMUSCULAR | Status: AC
  Filled 2020-01-20: qty 4

## 2020-01-20 MED ORDER — FENTANYL CITRATE (PF) 100 MCG/2ML IJ SOLN
INTRAMUSCULAR | Status: AC
  Filled 2020-01-20: qty 2

## 2020-01-20 MED ORDER — HYDROMORPHONE HCL 1 MG/ML IJ SOLN
INTRAMUSCULAR | Status: AC
  Administered 2020-01-20: 0.5 mg via INTRAVENOUS
  Filled 2020-01-20: qty 1

## 2020-01-20 MED ORDER — OXYCODONE HCL 5 MG/5ML PO SOLN
5.0000 mg | Freq: Once | ORAL | Status: AC | PRN
Start: 2020-01-20 — End: 2020-01-20

## 2020-01-20 MED ORDER — SUGAMMADEX SODIUM 500 MG/5ML IV SOLN
INTRAVENOUS | Status: DC | PRN
  Administered 2020-01-20: 300 mg via INTRAVENOUS

## 2020-01-20 MED ORDER — GABAPENTIN 100 MG PO CAPS
100.0000 mg | ORAL_CAPSULE | ORAL | Status: AC
  Administered 2020-01-20: 100 mg via ORAL
  Filled 2020-01-20: qty 1

## 2020-01-20 MED ORDER — CHLORHEXIDINE GLUCONATE CLOTH 2 % EX PADS: 6.0000 | MEDICATED_PAD | Freq: Once | CUTANEOUS | Status: DC

## 2020-01-20 MED ORDER — LACTATED RINGERS IV SOLN
INTRAVENOUS | Status: DC | PRN
  Administered 2020-01-20: 1000 mL

## 2020-01-20 MED ORDER — HYDROMORPHONE HCL 1 MG/ML IJ SOLN
INTRAMUSCULAR | Status: AC
  Filled 2020-01-20: qty 1

## 2020-01-20 MED ORDER — OXYCODONE HCL 5 MG PO TABS: 5.0000 mg | ORAL_TABLET | Freq: Four times a day (QID) | ORAL | 0 refills | Status: DC | PRN

## 2020-01-20 MED ORDER — FENTANYL CITRATE (PF) 250 MCG/5ML IJ SOLN
INTRAMUSCULAR | Status: AC
  Filled 2020-01-20: qty 5

## 2020-01-20 MED ORDER — PROPOFOL 10 MG/ML IV BOLUS
INTRAVENOUS | Status: AC
  Filled 2020-01-20: qty 40

## 2020-01-20 MED ORDER — MIDAZOLAM HCL 5 MG/5ML IJ SOLN
INTRAMUSCULAR | Status: DC | PRN
  Administered 2020-01-20: 2 mg via INTRAVENOUS

## 2020-01-20 MED ORDER — CHLORHEXIDINE GLUCONATE 0.12 % MT SOLN: 15.0000 mL | Freq: Once | OROMUCOSAL | Status: AC

## 2020-01-20 MED ORDER — ROCURONIUM BROMIDE 10 MG/ML (PF) SYRINGE
PREFILLED_SYRINGE | INTRAVENOUS | Status: AC
  Filled 2020-01-20: qty 10

## 2020-01-20 MED ORDER — DEXMEDETOMIDINE (PRECEDEX) IN NS 20 MCG/5ML (4 MCG/ML) IV SYRINGE
PREFILLED_SYRINGE | INTRAVENOUS | Status: AC
  Filled 2020-01-20: qty 5

## 2020-01-20 MED ORDER — ROCURONIUM BROMIDE 10 MG/ML (PF) SYRINGE
PREFILLED_SYRINGE | INTRAVENOUS | Status: AC
  Filled 2020-01-20: qty 20

## 2020-01-20 MED ORDER — LACTATED RINGERS IV SOLN: INTRAVENOUS | Status: DC

## 2020-01-20 MED ORDER — BUPIVACAINE-EPINEPHRINE (PF) 0.25% -1:200000 IJ SOLN
INTRAMUSCULAR | Status: AC
  Filled 2020-01-20: qty 30

## 2020-01-20 MED ORDER — PROPOFOL 10 MG/ML IV BOLUS
INTRAVENOUS | Status: DC | PRN
  Administered 2020-01-20: 200 mg via INTRAVENOUS

## 2020-01-20 MED ORDER — PHENYLEPHRINE HCL (PRESSORS) 10 MG/ML IV SOLN
INTRAVENOUS | Status: AC
  Filled 2020-01-20: qty 7

## 2020-01-20 MED ORDER — ACETAMINOPHEN 500 MG PO TABS
1000.0000 mg | ORAL_TABLET | ORAL | Status: AC
  Administered 2020-01-20: 1000 mg via ORAL
  Filled 2020-01-20: qty 2

## 2020-01-20 MED ORDER — DEXAMETHASONE SODIUM PHOSPHATE 10 MG/ML IJ SOLN
INTRAMUSCULAR | Status: DC | PRN
  Administered 2020-01-20: 10 mg via INTRAVENOUS

## 2020-01-20 MED ORDER — HYDROMORPHONE HCL 1 MG/ML IJ SOLN
0.2500 mg | INTRAMUSCULAR | Status: DC | PRN
  Administered 2020-01-20: 0.5 mg via INTRAVENOUS
  Administered 2020-01-20: 0.25 mg via INTRAVENOUS

## 2020-01-20 MED ORDER — ONDANSETRON HCL 4 MG/2ML IJ SOLN
INTRAMUSCULAR | Status: AC
  Filled 2020-01-20: qty 2

## 2020-01-20 MED ORDER — ORAL CARE MOUTH RINSE
15.0000 mL | Freq: Once | OROMUCOSAL | Status: AC
  Administered 2020-01-20: 15 mL via OROMUCOSAL

## 2020-01-20 MED ORDER — LIDOCAINE 2% (20 MG/ML) 5 ML SYRINGE
INTRAMUSCULAR | Status: DC | PRN
  Administered 2020-01-20: 100 mg via INTRAVENOUS

## 2020-01-20 MED ORDER — ONDANSETRON HCL 4 MG/2ML IJ SOLN
INTRAMUSCULAR | Status: DC | PRN
  Administered 2020-01-20: 4 mg via INTRAVENOUS

## 2020-01-20 MED ORDER — AMISULPRIDE (ANTIEMETIC) 5 MG/2ML IV SOLN: 10.0000 mg | Freq: Once | INTRAVENOUS | Status: DC | PRN

## 2020-01-20 MED ORDER — SUGAMMADEX SODIUM 500 MG/5ML IV SOLN
INTRAVENOUS | Status: AC
  Filled 2020-01-20: qty 5

## 2020-01-20 MED ORDER — 0.9 % SODIUM CHLORIDE (POUR BTL) OPTIME
TOPICAL | Status: DC | PRN
  Administered 2020-01-20: 1000 mL

## 2020-01-20 MED ORDER — MEPERIDINE HCL 50 MG/ML IJ SOLN: 6.2500 mg | INTRAMUSCULAR | Status: DC | PRN

## 2020-01-20 MED ORDER — FENTANYL CITRATE (PF) 100 MCG/2ML IJ SOLN
INTRAMUSCULAR | Status: DC | PRN
  Administered 2020-01-20 (×2): 50 ug via INTRAVENOUS
  Administered 2020-01-20 (×2): 100 ug via INTRAVENOUS
  Administered 2020-01-20: 50 ug via INTRAVENOUS

## 2020-01-20 MED ORDER — PROMETHAZINE HCL 25 MG/ML IJ SOLN: 6.2500 mg | INTRAMUSCULAR | Status: DC | PRN

## 2020-01-20 MED ORDER — DEXMEDETOMIDINE (PRECEDEX) IN NS 20 MCG/5ML (4 MCG/ML) IV SYRINGE
PREFILLED_SYRINGE | INTRAVENOUS | Status: DC | PRN
  Administered 2020-01-20 (×2): 8 ug via INTRAVENOUS

## 2020-01-20 MED ORDER — MIDAZOLAM HCL 2 MG/2ML IJ SOLN
INTRAMUSCULAR | Status: AC
  Filled 2020-01-20: qty 2

## 2020-01-20 MED ORDER — ENSURE PRE-SURGERY PO LIQD
296.0000 mL | Freq: Once | ORAL | Status: DC
  Filled 2020-01-20: qty 296

## 2020-01-20 SURGICAL SUPPLY — 50 items
APPLICATOR ARISTA FLEXITIP XL (MISCELLANEOUS) IMPLANT
APPLIER CLIP 5 13 M/L LIGAMAX5 (MISCELLANEOUS)
APPLIER CLIP ROT 10 11.4 M/L (STAPLE)
BENZOIN TINCTURE PRP APPL 2/3 (GAUZE/BANDAGES/DRESSINGS) IMPLANT
BNDG ADH 1X3 SHEER STRL LF (GAUZE/BANDAGES/DRESSINGS) ×8 IMPLANT
CABLE HIGH FREQUENCY MONO STRZ (ELECTRODE) ×2 IMPLANT
CHLORAPREP W/TINT 26 (MISCELLANEOUS) ×2 IMPLANT
CLIP APPLIE 5 13 M/L LIGAMAX5 (MISCELLANEOUS) IMPLANT
CLIP APPLIE ROT 10 11.4 M/L (STAPLE) IMPLANT
CLIP VESOLOCK MED LG 6/CT (CLIP) IMPLANT
CLSR STERI-STRIP ANTIMIC 1/2X4 (GAUZE/BANDAGES/DRESSINGS) ×2 IMPLANT
COVER MAYO STAND STRL (DRAPES) IMPLANT
COVER SURGICAL LIGHT HANDLE (MISCELLANEOUS) ×2 IMPLANT
COVER WAND RF STERILE (DRAPES) IMPLANT
DECANTER SPIKE VIAL GLASS SM (MISCELLANEOUS) ×2 IMPLANT
DERMABOND ADVANCED (GAUZE/BANDAGES/DRESSINGS)
DERMABOND ADVANCED .7 DNX12 (GAUZE/BANDAGES/DRESSINGS) IMPLANT
DRAPE C-ARM 42X120 X-RAY (DRAPES) IMPLANT
DRSG TEGADERM 2-3/8X2-3/4 SM (GAUZE/BANDAGES/DRESSINGS) ×8 IMPLANT
ELECT REM PT RETURN 15FT ADLT (MISCELLANEOUS) ×2 IMPLANT
GAUZE SPONGE 2X2 8PLY STRL LF (GAUZE/BANDAGES/DRESSINGS) ×1 IMPLANT
GLOVE BIO SURGEON STRL SZ7.5 (GLOVE) ×2 IMPLANT
GLOVE INDICATOR 8.0 STRL GRN (GLOVE) ×2 IMPLANT
GOWN STRL REUS W/TWL XL LVL3 (GOWN DISPOSABLE) ×6 IMPLANT
GRASPER SUT TROCAR 14GX15 (MISCELLANEOUS) ×2 IMPLANT
HEMOSTAT ARISTA ABSORB 3G PWDR (HEMOSTASIS) IMPLANT
HEMOSTAT SNOW SURGICEL 2X4 (HEMOSTASIS) ×4 IMPLANT
KIT BASIN OR (CUSTOM PROCEDURE TRAY) ×2 IMPLANT
KIT TURNOVER KIT A (KITS) IMPLANT
L-HOOK LAP DISP 36CM (ELECTROSURGICAL)
LHOOK LAP DISP 36CM (ELECTROSURGICAL) IMPLANT
POUCH RETRIEVAL ECOSAC 10 (ENDOMECHANICALS) ×1 IMPLANT
POUCH RETRIEVAL ECOSAC 10MM (ENDOMECHANICALS) ×1
SCISSORS LAP 5X35 DISP (ENDOMECHANICALS) ×2 IMPLANT
SET CHOLANGIOGRAPH MIX (MISCELLANEOUS) IMPLANT
SET IRRIG TUBING LAPAROSCOPIC (IRRIGATION / IRRIGATOR) ×2 IMPLANT
SET TUBE SMOKE EVAC HIGH FLOW (TUBING) ×2 IMPLANT
SLEEVE XCEL OPT CAN 5 100 (ENDOMECHANICALS) ×4 IMPLANT
SPONGE GAUZE 2X2 STER 10/PKG (GAUZE/BANDAGES/DRESSINGS) ×1
STRIP CLOSURE SKIN 1/2X4 (GAUZE/BANDAGES/DRESSINGS) IMPLANT
SUT MNCRL AB 4-0 PS2 18 (SUTURE) ×4 IMPLANT
SUT VIC AB 0 UR5 27 (SUTURE) IMPLANT
SUT VICRYL 0 TIES 12 18 (SUTURE) IMPLANT
SUT VICRYL 0 UR6 27IN ABS (SUTURE) ×2 IMPLANT
TOWEL OR 17X26 10 PK STRL BLUE (TOWEL DISPOSABLE) ×2 IMPLANT
TOWEL OR NON WOVEN STRL DISP B (DISPOSABLE) ×2 IMPLANT
TRAY LAPAROSCOPIC (CUSTOM PROCEDURE TRAY) ×2 IMPLANT
TROCAR BLADELESS OPT 5 100 (ENDOMECHANICALS) ×2 IMPLANT
TROCAR XCEL BLUNT TIP 100MML (ENDOMECHANICALS) IMPLANT
TROCAR XCEL NON-BLD 11X100MML (ENDOMECHANICALS) IMPLANT

## 2020-01-20 NOTE — Anesthesia Preprocedure Evaluation (Signed)
Anesthesia Evaluation  Patient identified by MRN, date of birth, ID band Patient awake    Reviewed: Allergy & Precautions, H&P , NPO status , Patient's Chart, lab work & pertinent test results  Airway Mallampati: II  TM Distance: >3 FB Neck ROM: Full    Dental no notable dental hx.    Pulmonary neg pulmonary ROS,    Pulmonary exam normal breath sounds clear to auscultation       Cardiovascular negative cardio ROS Normal cardiovascular exam Rhythm:Regular Rate:Normal     Neuro/Psych negative neurological ROS  negative psych ROS   GI/Hepatic negative GI ROS, Neg liver ROS,   Endo/Other  negative endocrine ROS  Renal/GU negative Renal ROS  negative genitourinary   Musculoskeletal negative musculoskeletal ROS (+)   Abdominal (+) + obese,   Peds negative pediatric ROS (+)  Hematology negative hematology ROS (+)   Anesthesia Other Findings   Reproductive/Obstetrics negative OB ROS                             Anesthesia Physical Anesthesia Plan  ASA: II  Anesthesia Plan: General   Post-op Pain Management:    Induction: Intravenous  PONV Risk Score and Plan: 2 and Ondansetron, Midazolam and Treatment may vary due to age or medical condition  Airway Management Planned: Oral ETT  Additional Equipment:   Intra-op Plan:   Post-operative Plan: Extubation in OR  Informed Consent: I have reviewed the patients History and Physical, chart, labs and discussed the procedure including the risks, benefits and alternatives for the proposed anesthesia with the patient or authorized representative who has indicated his/her understanding and acceptance.     Dental advisory given  Plan Discussed with: CRNA  Anesthesia Plan Comments:         Anesthesia Quick Evaluation  

## 2020-01-20 NOTE — Discharge Instructions (Signed)
[facility] °LAPAROSCOPIC SURGERY: POST OP INSTRUCTIONS °Always review your discharge instruction sheet given to you by the facility where your surgery was performed. °IF YOU HAVE DISABILITY OR FAMILY LEAVE FORMS, YOU MUST BRING THEM TO THE OFFICE FOR PROCESSING.   °DO NOT GIVE THEM TO YOUR DOCTOR. ° °PAIN CONTROL ° °1. First take acetaminophen (Tylenol) AND/or ibuprofen (Advil) to control your pain after surgery.  Follow directions on package.  Taking acetaminophen (Tylenol) and/or ibuprofen (Advil) regularly after surgery will help to control your pain and lower the amount of prescription pain medication you may need.  You should not take more than 3,000 mg (3 grams) of acetaminophen (Tylenol) in 24 hours.  You should not take ibuprofen (Advil), aleve, motrin, naprosyn or other NSAIDS if you have a history of stomach ulcers or chronic kidney disease.  °2. A prescription for pain medication may be given to you upon discharge.  Take your pain medication as prescribed, if you still have uncontrolled pain after taking acetaminophen (Tylenol) or ibuprofen (Advil). °3. Use ice packs to help control pain. °4. If you need a refill on your pain medication, please contact your pharmacy.  They will contact our office to request authorization. Prescriptions will not be filled after 5pm or on week-ends. ° °HOME MEDICATIONS °5. Take your usually prescribed medications unless otherwise directed. ° °DIET °6. You should follow a light diet the first few days after arrival home.  Be sure to include lots of fluids daily. Avoid fatty, fried foods.  ° °CONSTIPATION °7. It is common to experience some constipation after surgery and if you are taking pain medication.  Increasing fluid intake and taking a stool softener (such as Colace) will usually help or prevent this problem from occurring.  A mild laxative (Milk of Magnesia or Miralax) should be taken according to package instructions if there are no bowel  movements after 48 hours. ° °WOUND/INCISION CARE °8. Most patients will experience some swelling and bruising in the area of the incisions.  Ice packs will help.  Swelling and bruising can take several days to resolve.  °9. Unless discharge instructions indicate otherwise, follow guidelines below  °a. STERI-STRIPS - you may remove your outer bandages 48 hours after surgery, and you may shower at that time.  You have steri-strips (small skin tapes) in place directly over the incision.  These strips should be left on the skin for 7-10 days.   °b. DERMABOND/SKIN GLUE - you may shower in 24 hours.  The glue will flake off over the next 2-3 weeks. °10. Any sutures or staples will be removed at the office during your follow-up visit. ° °ACTIVITIES °11. You may resume regular (light) daily activities beginning the next day--such as daily self-care, walking, climbing stairs--gradually increasing activities as tolerated.  You may have sexual intercourse when it is comfortable.  Refrain from any heavy lifting or straining until approved by your doctor. °a. You may drive when you are no longer taking prescription pain medication, you can comfortably wear a seatbelt, and you can safely maneuver your car and apply brakes. ° °FOLLOW-UP °12. You should see your doctor in the office for a follow-up appointment approximately 2-3 weeks after your surgery.  You should have been given your post-op/follow-up appointment when your surgery was scheduled.  If you did not receive a post-op/follow-up appointment, make sure that you call for this appointment within a day or two after you arrive home to insure a convenient appointment time. ° °OTHER   INSTRUCTIONS °13.  ° °WHEN TO CALL YOUR DOCTOR: °1. Fever over 101.0 °2. Inability to urinate °3. Continued bleeding from incision. °4. Increased pain, redness, or drainage from the incision. °5. Increasing abdominal pain ° °The clinic staff is available to answer your questions during regular  business hours.  Please don’t hesitate to call and ask to speak to one of the nurses for clinical concerns.  If you have a medical emergency, go to the nearest emergency room or call 911.  A surgeon from [facility] is always on call at the hospital. °[address], [address], [place], [place]  [address] ? [address] [address], [place], [place]   [address] °[phone] ? [phone] ? FAX [phone] °Web site: [email or web address] ° °••••••••• ° ° °Managing Your Pain After Surgery Without Opioids ° ° ° °Thank you for participating in our program to help patients manage their pain after surgery without opioids. This is part of our effort to provide you with the best care possible, without exposing you or your family to the risk that opioids pose. ° °What pain can I expect after surgery? °You can expect to have some pain after surgery. This is normal. The pain is typically worse the day after surgery, and quickly begins to get better. °Many studies have found that many patients are able to manage their pain after surgery with Over-the-Counter (OTC) medications such as Tylenol and Motrin. If you have a condition that does not allow you to take Tylenol or Motrin, notify your surgical team. ° °How will I manage my pain? °The best strategy for controlling your pain after surgery is around the clock pain control with Tylenol (acetaminophen) and Motrin (ibuprofen or Advil). Alternating these medications with each other allows you to maximize your pain control. In addition to Tylenol and Motrin, you can use heating pads or ice packs on your incisions to help reduce your pain. ° °How will I alternate your regular strength over-the-counter pain medication? °You will take a dose of pain medication every three hours. °; Start by taking 650 mg of Tylenol (2 pills of 325 mg) °; 3 hours later take 600 mg of Motrin (3 pills of 200 mg) °; 3 hours after taking the Motrin take 650 mg of Tylenol °; 3 hours  after that take 600 mg of Motrin. ° ° °- 1 - ° °See example - if your first dose of Tylenol is at 12:00 PM ° ° °12:00 PM Tylenol 650 mg (2 pills of 325 mg)  °3:00 PM Motrin 600 mg (3 pills of 200 mg)  °6:00 PM Tylenol 650 mg (2 pills of 325 mg)  °9:00 PM Motrin 600 mg (3 pills of 200 mg)  °Continue alternating every 3 hours  ° °We recommend that you follow this schedule around-the-clock for at least 3 days after surgery, or until you feel that it is no longer needed. Use the table on the last page of this handout to keep track of the medications you are taking. °Important: °Do not take more than 3000mg of Tylenol or 3200mg of Motrin in a 24-hour period. °Do not take ibuprofen/Motrin if you have a history of bleeding stomach ulcers, severe kidney disease, &/or actively taking a blood thinner ° °What if I still have pain? °If you have pain that is not controlled with the over-the-counter pain medications (Tylenol and Motrin or Advil) you might have what we call “breakthrough” pain. You will receive a prescription for a small amount of an opioid   pain medication such as Oxycodone, Tramadol, or Tylenol with Codeine. Use these opioid pills in the first 24 hours after surgery if you have breakthrough pain. Do not take more than 1 pill every 4-6 hours. ° °If you still have uncontrolled pain after using all opioid pills, don't hesitate to call our staff using the number provided. We will help make sure you are managing your pain in the best way possible, and if necessary, we can provide a prescription for additional pain medication. ° ° °Day 1   ° °Time  °Name of Medication Number of pills taken  °Amount of Acetaminophen  °Pain Level  ° °Comments  °AM PM       °AM PM       °AM PM       °AM PM       °AM PM       °AM PM       °AM PM       °AM PM       °Total Daily amount of Acetaminophen °Do not take more than  3,000 mg per day    ° ° °Day 2   ° °Time  °Name of Medication Number of pills °taken  °Amount of Acetaminophen  °Pain  Level  ° °Comments  °AM PM       °AM PM       °AM PM       °AM PM       °AM PM       °AM PM       °AM PM       °AM PM       °Total Daily amount of Acetaminophen °Do not take more than  3,000 mg per day    ° ° °Day 3   ° °Time  °Name of Medication Number of pills taken  °Amount of Acetaminophen  °Pain Level  ° °Comments  °AM PM       °AM PM       °AM PM       °AM PM       ° ° ° °AM PM       °AM PM       °AM PM       °AM PM       °Total Daily amount of Acetaminophen °Do not take more than  3,000 mg per day    ° ° °Day 4   ° °Time  °Name of Medication Number of pills taken  °Amount of Acetaminophen  °Pain Level  ° °Comments  °AM PM       °AM PM       °AM PM       °AM PM       °AM PM       °AM PM       °AM PM       °AM PM       °Total Daily amount of Acetaminophen °Do not take more than  3,000 mg per day    ° ° °Day 5   ° °Time  °Name of Medication Number °of pills taken  °Amount of Acetaminophen  °Pain Level  ° °Comments  °AM PM       °AM PM       °AM PM       °AM PM       °AM PM       °AM PM       °  AM PM       °AM PM       °Total Daily amount of Acetaminophen °Do not take more than  3,000 mg per day    ° ° ° °Day 6   ° °Time  °Name of Medication Number of pills °taken  °Amount of Acetaminophen  °Pain Level  °Comments  °AM PM       °AM PM       °AM PM       °AM PM       °AM PM       °AM PM       °AM PM       °AM PM       °Total Daily amount of Acetaminophen °Do not take more than  3,000 mg per day    ° ° °Day 7   ° °Time  °Name of Medication Number of pills taken  °Amount of Acetaminophen  °Pain Level  ° °Comments  °AM PM       °AM PM       °AM PM       °AM PM       °AM PM       °AM PM       °AM PM       °AM PM       °Total Daily amount of Acetaminophen °Do not take more than  3,000 mg per day    ° ° ° ° °For additional information about how and where to safely dispose of unused opioid °medications - https://www.morepowerfulnc.org ° °Disclaimer: This document contains information and/or instructional materials adapted  from [facility] for the typical patient with your condition. It does not replace medical advice from your health care provider because your experience may differ from that of the °typical patient. Talk to your health care provider if you have any questions about this °document, your condition or your treatment plan. °Adapted from [facility] ° ° °

## 2020-01-20 NOTE — Anesthesia Procedure Notes (Signed)
Procedure Name: Intubation Date/Time: 01/20/2020 7:40 AM Performed by: Lavina Hamman, CRNA Pre-anesthesia Checklist: Patient identified, Emergency Drugs available, Suction available, Patient being monitored and Timeout performed Patient Re-evaluated:Patient Re-evaluated prior to induction Oxygen Delivery Method: Circle system utilized Preoxygenation: Pre-oxygenation with 100% oxygen Induction Type: IV induction Ventilation: Mask ventilation without difficulty and Oral airway inserted - appropriate to patient size Laryngoscope Size: Mac and 4 Grade View: Grade I Tube type: Oral Tube size: 8.0 mm Number of attempts: 1 Airway Equipment and Method: Stylet Placement Confirmation: ETT inserted through vocal cords under direct vision,  positive ETCO2,  CO2 detector and breath sounds checked- equal and bilateral Secured at: 23 cm Tube secured with: Tape Dental Injury: Teeth and Oropharynx as per pre-operative assessment  Comments: ATOI

## 2020-01-20 NOTE — H&P (Signed)
   CC: here for surgery  Requesting provider: n/a  HPI: Erik Anderson is an 35 y.o. male who is here for gallbladder surgery. No changes since seen in clinic.  pls see older note in chart.   Past Medical History:  Diagnosis Date  . IBS (irritable bowel syndrome)    Colonoscopy completed 2019 for IBS symptoms    Past Surgical History:  Procedure Laterality Date  . COLONOSCOPY  2019   Patient reports normal.  Completed for IBS symptoms.    Family History  Problem Relation Age of Onset  . Hypertension Mother   . Hypertension Brother   . Breast cancer Paternal Grandmother   . Asthma Paternal Grandfather     Social:  reports that he has never smoked. He has never used smokeless tobacco. He reports current alcohol use. He reports that he does not use drugs.  Allergies: No Known Allergies  Medications: I have reviewed the patient's current medications.   ROS - all of the below systems have been reviewed with the patient and positives are indicated with bold text General: chills, fever or night sweats Eyes: blurry vision or double vision ENT: epistaxis or sore throat Allergy/Immunology: itchy/watery eyes or nasal congestion Hematologic/Lymphatic: bleeding problems, blood clots or swollen lymph nodes Endocrine: temperature intolerance or unexpected weight changes Breast: new or changing breast lumps or nipple discharge Resp: cough, shortness of breath, or wheezing CV: chest pain or dyspnea on exertion GI: as per HPI GU: dysuria, trouble voiding, or hematuria MSK: joint pain or joint stiffness Neuro: TIA or stroke symptoms Derm: pruritus and skin lesion changes Psych: anxiety and depression  PE Blood pressure 140/86, pulse 79, temperature 98.3 F (36.8 C), temperature source Oral, resp. rate 16, SpO2 98 %. Constitutional: NAD; conversant; no deformities Eyes: Moist conjunctiva; no lid lag; anicteric; PERRL Neck: Trachea midline; no thyromegaly Lungs: Normal  respiratory effort; no tactile fremitus CV: RRR; no palpable thrills; no pitting edema GI: Abd soft, nt, nd; no palpable hepatosplenomegaly MSK: Normal gait; no clubbing/cyanosis Psychiatric: Appropriate affect; alert and oriented x3 Lymphatic: No palpable cervical or axillary lymphadenopathy Skin:no rash/lesion  No results found for this or any previous visit (from the past 48 hour(s)).  No results found.  Imaging: imaging  A/P: Erik Anderson is an 35 y.o. male with symptomatic cholelithiasis  To or for Lap chole  Eras Iv abx All questions asked and answered  Mary Sella. Andrey Campanile, MD, FACS General, Bariatric, & Minimally Invasive Surgery [facility], [place]

## 2020-01-20 NOTE — Transfer of Care (Signed)
Immediate Anesthesia Transfer of Care Note  Patient: Erik Anderson  Procedure(s) Performed: Procedure(s): LAPAROSCOPIC CHOLECYSTECTOMY (N/A)  Patient Location: PACU  Anesthesia Type:General  Level of Consciousness:  sedated, patient cooperative and responds to stimulation  Airway & Oxygen Therapy:Patient Spontanous Breathing and Patient connected to face mask oxgen  Post-op Assessment:  Report given to PACU RN and Post -op Vital signs reviewed and stable  Post vital signs:  Reviewed and stable  Last Vitals:  Vitals:   01/20/20 0623  BP: 140/86  Pulse: 79  Resp: 16  Temp: 36.8 C  SpO2: 98%    Complications: No apparent anesthesia complications

## 2020-01-20 NOTE — Anesthesia Postprocedure Evaluation (Signed)
Anesthesia Post Note  Patient: Erik Anderson [facility]  Procedure(s) Performed: LAPAROSCOPIC CHOLECYSTECTOMY (N/A )     Patient location during evaluation: PACU Anesthesia Type: General Level of consciousness: awake and alert Pain management: pain level controlled Vital Signs Assessment: post-procedure vital signs reviewed and stable Respiratory status: spontaneous breathing, nonlabored ventilation and respiratory function stable Cardiovascular status: blood pressure returned to baseline and stable Postop Assessment: no apparent nausea or vomiting Anesthetic complications: no   No complications documented.  Last Vitals:  Vitals:   01/20/20 1100 01/20/20 1122  BP: (!) 147/95 136/85  Pulse: 90 (!) 101  Resp:  16  Temp:  36.6 C  SpO2: 96% 95%    Last Pain:  Vitals:   01/20/20 1122  TempSrc:   PainSc: 4                  Lowella Curb

## 2020-01-20 NOTE — Op Note (Addendum)
Erik Anderson [facility] [id number] August 09, 1985 01/20/2020  Laparoscopic Cholecystectomy with attempted IOC Procedure Note  Indications: This patient presents with symptomatic gallbladder disease and will undergo laparoscopic cholecystectomy.  Pre-operative Diagnosis: symptomatic cholelithiasis  Post-operative Diagnosis: Same, probable chronic cholecystitis  Surgeon: Gaynelle Adu MD FACS  Assistants: Dr Dionicia Abler (surgical resident)  Anesthesia: General endotracheal anesthesia  Procedure Details  The patient was seen again in the Holding Room. The risks, benefits, complications, treatment options, and expected outcomes were discussed with the patient. The possibilities of reaction to medication, pulmonary aspiration, perforation of viscus, bleeding, recurrent infection, finding a normal gallbladder, the need for additional procedures, failure to diagnose a condition, the possible need to convert to an open procedure, and creating a complication requiring transfusion or operation were discussed with the patient. The likelihood of improving the patient's symptoms with return to their baseline status is good.  The patient and/or family concurred with the proposed plan, giving informed consent. The site of surgery properly noted. The patient was taken to Operating Room, identified as Erik Anderson [facility] and the procedure verified as Laparoscopic Cholecystectomy with Intraoperative Cholangiogram. A Time Out was held and the above information confirmed. Antibiotic prophylaxis was administered.   Prior to the induction of general anesthesia, antibiotic prophylaxis was administered. General endotracheal anesthesia was then administered and tolerated well. After the induction, the abdomen was prepped with Chloraprep and draped in the sterile fashion. The patient was positioned in the supine position.  Local anesthetic agent was injected into the skin near the umbilicus and an incision made. We dissected down  to the abdominal fascia with blunt dissection.  The fascia was incised vertically and we entered the peritoneal cavity bluntly.  A pursestring suture of 0-Vicryl was placed around the fascial opening.  The Hasson cannula was inserted and secured with the stay suture.  Pneumoperitoneum was then created with CO2 and tolerated well without any adverse changes in the patient's vital signs. An 5-mm port was placed in the subxiphoid position.  Two 5-mm ports were placed in the right upper quadrant. All skin incisions were infiltrated with a local anesthetic agent before making the incision and placing the trocars.   We positioned the patient in reverse Trendelenburg, tilted slightly to the patient's left.  The gallbladder was identified, the fundus grasped and retracted cephalad. The gallbladder was slightly intrahepatic.  Adhesions were lysed bluntly and with the electrocautery where indicated by the resident, taking care not to injure any adjacent organs or viscus. The infundibulum was grasped and retracted laterally, exposing the peritoneum overlying the triangle of Calot. This was then divided and exposed in a blunt fashion. A critical view of the cystic duct and cystic artery was obtained.  The cystic duct was clearly identified and bluntly dissected circumferentially. The cystic duct was ligated with a clip distally.   An incision was made in the cystic duct and the [facility] cholangiogram catheter was attempted to be introduced. But the resident nor I could advance it far enough to secure with a clip. There was free flow of bile from the opening. Therefore a cholangiogram was not performed.    The cystic duct was then ligated with clips and divided. The cystic artery which had been identified & dissected free was ligated with clips and divided as well.   The gallbladder was dissected from the liver bed in retrograde fashion with the electrocautery. We did get into some bleeding in this area. It appeared to be a  posterior branch of the cystic artery.  It was clipped with 2 clips by me.  The gallbladder was removed and placed in an Ecco sac.  The gallbladder and Ecco sac were then removed through the umbilical port site. The liver bed was irrigated and inspected. Hemostasis was achieved with the electrocautery. Copious irrigation was utilized and was repeatedly aspirated until clear. I did place a piece of surgical snow in the gallbladder fossa.   The pursestring suture was used to close the umbilical fascia.    We again inspected the right upper quadrant for hemostasis.  The umbilical closure was inspected and there was no air leak and nothing trapped within the closure. But there was an airleak. So 2 additional interrupted 0 vicryl sutures were placed with a PMI suture passer.  Pneumoperitoneum was released as we removed the trocars.  4-0 Monocryl was used to close the skin.   , steri-strips, and clean dressings were applied. The patient was then extubated and brought to the recovery room in stable condition. Instrument, sponge, and needle counts were correct at closure and at the conclusion of the case.   Findings: Chronic Cholecystitis with Cholelithiasis +snow +critical view  Estimated Blood Loss: less than 100 mL         Drains: none         Specimens: Gallbladder           Complications: None; patient tolerated the procedure well.         Disposition: PACU - hemodynamically stable.         Condition: stable  I was personally present & scrubbed and performed certain portions of the procedure and immediately available while resident closed one of the trocar sites as documented in my operative note.   Mary Sella. Andrey Campanile, MD, FACS General, Bariatric, & Minimally Invasive Surgery [facility], [place]

## 2020-01-21 ENCOUNTER — Encounter (HOSPITAL_COMMUNITY): Payer: Self-pay | Admitting: General Surgery

## 2020-01-21 LAB — SURGICAL PATHOLOGY

## 2020-09-29 ENCOUNTER — Other Ambulatory Visit: Payer: Self-pay

## 2020-09-29 ENCOUNTER — Encounter: Payer: Self-pay | Admitting: Family Medicine

## 2020-09-29 ENCOUNTER — Ambulatory Visit (INDEPENDENT_AMBULATORY_CARE_PROVIDER_SITE_OTHER): Payer: 59 | Admitting: Family Medicine

## 2020-09-29 VITALS — BP 134/72 | HR 75 | Temp 98.1°F | Ht 76.38 in | Wt 277.0 lb

## 2020-09-29 DIAGNOSIS — H6981 Other specified disorders of Eustachian tube, right ear: Secondary | ICD-10-CM | POA: Diagnosis not present

## 2020-09-29 DIAGNOSIS — H9201 Otalgia, right ear: Secondary | ICD-10-CM

## 2020-09-29 DIAGNOSIS — H9191 Unspecified hearing loss, right ear: Secondary | ICD-10-CM | POA: Diagnosis not present

## 2020-09-29 DIAGNOSIS — Z23 Encounter for immunization: Secondary | ICD-10-CM

## 2020-09-29 MED ORDER — FLUTICASONE PROPIONATE 50 MCG/ACT NA SUSP: 2.0000 | Freq: Every day | NASAL | 6 refills | Status: AC

## 2020-09-29 NOTE — Progress Notes (Signed)
This visit occurred during the SARS-CoV-2 public health emergency.  Safety protocols were in place, including screening questions prior to the visit, additional usage of staff PPE, and extensive cleaning of exam room while observing appropriate contact time as indicated for disinfecting solutions.    Erik Anderson [facility] , Nov 24, 1985, 35 y.o., male MRN: [id number] Patient Care Team    Relationship Specialty Notifications Start End  Erik Leatherwood, DO PCP - General Family Medicine  09/21/18     Chief Complaint  Patient presents with   Hearing Problem    Pt c/o hearing loss x 1 mo after a flight; pt states he can hear some crackling when swallow     Subjective: Pt presents for an OV with complaints of ear discomfort and decreased hearing for 1 month. He is now flying about twice a week and noticed the sx after flight. He reports it will sound crackly at times and hearing will return for a few moments. No fever, trauma or ear drainage.   Depression screen [facility] 2/9 09/29/2020 09/21/2018  Decreased Interest 0 0  Down, Depressed, Hopeless 0 0  PHQ - 2 Score 0 0    No Known Allergies Social History   Social History Narrative   Marital status/children/pets: Married   Education/employment: Employed, some college.  Works in Air[employer].   Safety:      -smoke alarm in the home:Yes     - wears seatbelt: Yes     - Feels safe in their relationships: Yes   Past Medical History:  Diagnosis Date   IBS (irritable bowel syndrome)    Colonoscopy completed 2019 for IBS symptoms   Past Surgical History:  Procedure Laterality Date   CHOLECYSTECTOMY N/A 01/20/2020   Procedure: LAPAROSCOPIC CHOLECYSTECTOMY;  Surgeon: Erik Adu, MD;  Location: [facility];  Service: General;  Laterality: N/A;   COLONOSCOPY  2019   Patient reports normal.  Completed for IBS symptoms.   Family History  Problem Relation Age of Onset   Hypertension Mother    Hypertension Brother    Breast cancer Paternal Grandmother     Asthma Paternal Grandfather    Allergies as of 09/29/2020   No Known Allergies      Medication List        Accurate as of September 29, 2020  2:56 PM. If you have any questions, ask your nurse or doctor.          STOP taking these medications    naproxen sodium 220 MG tablet Commonly known as: ALEVE Stopped by: Erik Pacini, DO   oxyCODONE 5 MG immediate release tablet Commonly known as: Oxy IR/ROXICODONE Stopped by: Erik Pacini, DO       TAKE these medications    fluticasone 50 MCG/ACT nasal spray Commonly known as: FLONASE Place 2 sprays into both nostrils daily. Started by: Erik Pacini, DO        All past medical history, surgical history, allergies, family history, immunizations andmedications were updated in the EMR today and reviewed under the history and medication portions of their EMR.     ROS: Negative, with the exception of above mentioned in HPI   Objective:  BP 134/72   Pulse 75   Temp 98.1 F (36.7 C) (Oral)   Ht 6' 4.38" (1.94 m)   Wt 277 lb (125.6 kg)   SpO2 96%   BMI 33.38 kg/m  Body mass index is 33.38 kg/m. Gen: Afebrile. No acute distress. Nontoxic in appearance, well developed, well  nourished.  HENT: AT. [place]. Bilateral TM visualized no erythema. Mild effusion b/l.  MMM, no oral lesions. Bilateral nares w/o erythema, drainage. Throat without erythema or exudates. No cough or hoarsness.  Eyes:Pupils Equal Round Reactive to light, Extraocular movements intact,  Conjunctiva without redness, discharge or icterus. Neck/lymp/endocrine: Supple,no lymphadenopathy Neuro: Normal gait. PERLA. EOMi. Alert. Oriented x3  Psych: Normal affect, dress and demeanor. Normal speech. Normal thought content and judgment.  No results found.  No results found. No results found. No results found for this or any previous visit (from the past 24 hour(s)).  Assessment/Plan: Erik Anderson is a 35 y.o. male present for OV for  1. Eustachian  tube dysfunction, right/Discomfort of right ear/Decreased hearing of right ear Hearing is normal by exam today.  - discussed eustachian tube dysfx from his flights. Preventive measures discussed.  - flonase nasal spray bid prescribed.  - galbreath maneuver discussed.  - f/u PRN  Reviewed expectations re: course of current medical issues. Discussed self-management of symptoms. Outlined signs and symptoms indicating need for more acute intervention. Patient verbalized understanding and all questions were answered. Patient received an After-Visit Summary.    Orders Placed This Encounter  Procedures   Flu Vaccine QUAD 6+ mos PF IM (Fluarix Quad PF)   Meds ordered this encounter  Medications   fluticasone (FLONASE) 50 MCG/ACT nasal spray    Sig: Place 2 sprays into both nostrils daily.    Dispense:  16 g    Refill:  6   Referral Orders  No referral(s) requested today     Note is dictated utilizing voice recognition software. Although note has been proof read prior to signing, occasional typographical errors still can be missed. If any questions arise, please do not hesitate to call for verification.   electronically signed by:  Erik Pacini, DO  [facility] - OR

## 2020-09-29 NOTE — Patient Instructions (Signed)
Great to see you today.  I have refilled the medication(s) we provide.   If labs were collected, we will inform you of lab results once received either by echart message or telephone call.   - echart message- for normal results that have been seen by the patient already.   - telephone call: abnormal results or if patient has not viewed results in their echart.  Galbreath maneuver    Eustachian Tube Dysfunction Eustachian tube dysfunction refers to a condition in which a blockage develops in the narrow passage that connects the middle ear to the back of the nose (eustachian tube). The eustachian tube regulates air pressure in the middle ear by letting air move between the ear and nose. It also helps to drain fluid from the middle ear space. Eustachian tube dysfunction can affect one or both ears. When the eustachian tube does not function properly, air pressure, fluid, or both can build up in the middle ear. What are the causes? This condition occurs when the eustachian tube becomes blocked or cannot open normally. Common causes of this condition include: Ear infections. Colds and other infections that affect the nose, mouth, and throat (upper respiratory tract). Allergies. Irritation from cigarette smoke. Irritation from stomach acid coming up into the esophagus (gastroesophageal reflux). The esophagus is the part of the body that moves food from the mouth to the stomach. Sudden changes in air pressure, such as from descending in an airplane or scuba diving. Abnormal growths in the nose or throat, such as: Growths that line the nose (nasal polyps). Abnormal growth of cells (tumors). Enlarged tissue at the back of the throat (adenoids). What increases the risk? You are more likely to develop this condition if: You smoke. You are overweight. You are a child who has: Certain birth defects of the mouth, such as cleft palate. Large tonsils or adenoids. What are the signs or  symptoms? Common symptoms of this condition include: A feeling of fullness in the ear. Ear pain. Clicking or popping noises in the ear. Ringing in the ear (tinnitus). Hearing loss. Loss of balance. Dizziness. Symptoms may get worse when the air pressure around you changes, such as when you travel to an area of high elevation, fly on an airplane, or go scuba diving. How is this diagnosed? This condition may be diagnosed based on: Your symptoms. A physical exam of your ears, nose, and throat. Tests, such as those that measure: The movement of your eardrum. Your hearing (audiometry). How is this treated? Treatment depends on the cause and severity of your condition. In mild cases, you may relieve your symptoms by moving air into your ears. This is called "popping the ears." In more severe cases, or if you have symptoms of fluid in your ears, treatment may include: Medicines to relieve congestion (decongestants). Medicines that treat allergies (antihistamines). Nasal sprays or ear drops that contain medicines that reduce swelling (steroids). A procedure to drain the fluid in your eardrum. In this procedure, a small tube may be placed in the eardrum to: Drain the fluid. Restore the air in the middle ear space. A procedure to insert a balloon device through the nose to inflate the opening of the eustachian tube (balloon dilation). Follow these instructions at home: Lifestyle Do not do any of the following until your health care provider approves: Travel to high altitudes. Fly in airplanes. Work in a Estate agent or room. Scuba dive. Do not use any products that contain nicotine or tobacco. These products include  cigarettes, chewing tobacco, and vaping devices, such as e-cigarettes. If you need help quitting, ask your health care provider. Keep your ears dry. Wear fitted earplugs during showering and bathing. Dry your ears completely after. General instructions Take  over-the-counter and prescription medicines only as told by your health care provider. Use techniques to help pop your ears as recommended by your health care provider. These may include: Chewing gum. Yawning. Frequent, forceful swallowing. Closing your mouth, holding your nose closed, and gently blowing as if you are trying to blow air out of your nose. Keep all follow-up visits. This is important. Contact a health care provider if: Your symptoms do not go away after treatment. Your symptoms come back after treatment. You are unable to pop your ears. You have: A fever. Pain in your ear. Pain in your head or neck. Fluid draining from your ear. Your hearing suddenly changes. You become very dizzy. You lose your balance. Get help right away if: You have a sudden, severe increase in any of your symptoms. Summary Eustachian tube dysfunction refers to a condition in which a blockage develops in the eustachian tube. It can be caused by ear infections, allergies, inhaled irritants, or abnormal growths in the nose or throat. Symptoms may include ear pain or fullness, hearing loss, or ringing in the ears. Mild cases are treated with techniques to unblock the ears, such as yawning or chewing gum. More severe cases are treated with medicines or procedures. This information is not intended to replace advice given to you by your health care provider. Make sure you discuss any questions you have with your health care provider. Document Revised: 03/06/2020 Document Reviewed: 03/06/2020 Elsevier Patient Education  2022 [employer].

## 2021-12-07 IMAGING — US US ABDOMEN COMPLETE
1 series · 13 of 25 positions shown · non-contrast
Comparison: None.

CLINICAL DATA: Chronic right upper quadrant pain.

EXAM:
ABDOMEN ULTRASOUND COMPLETE

[Series 1: us abdomen complete · 13 of 85 slices shown]
[im 1/85]
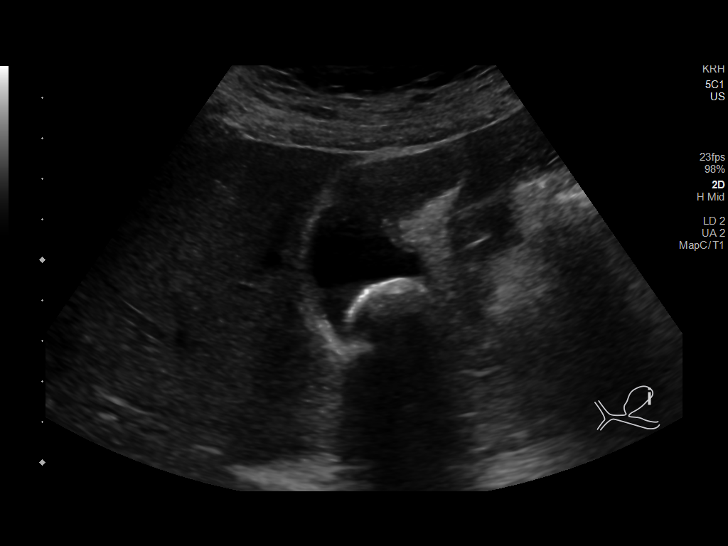
[im 8/85]
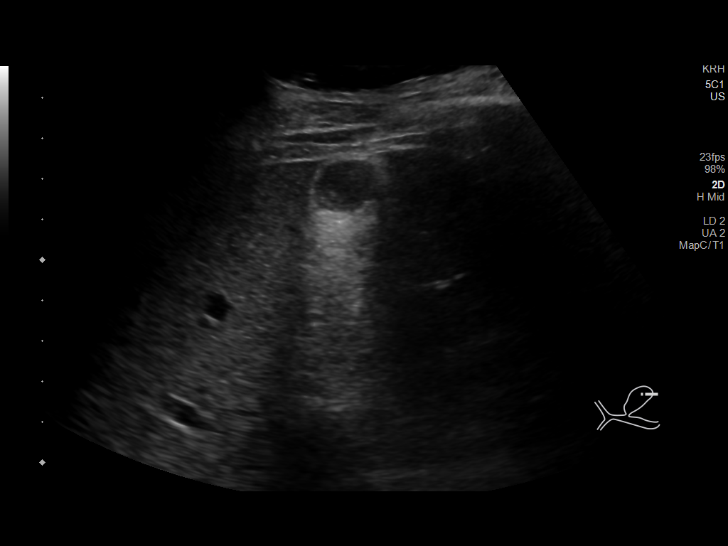
[im 15/85]
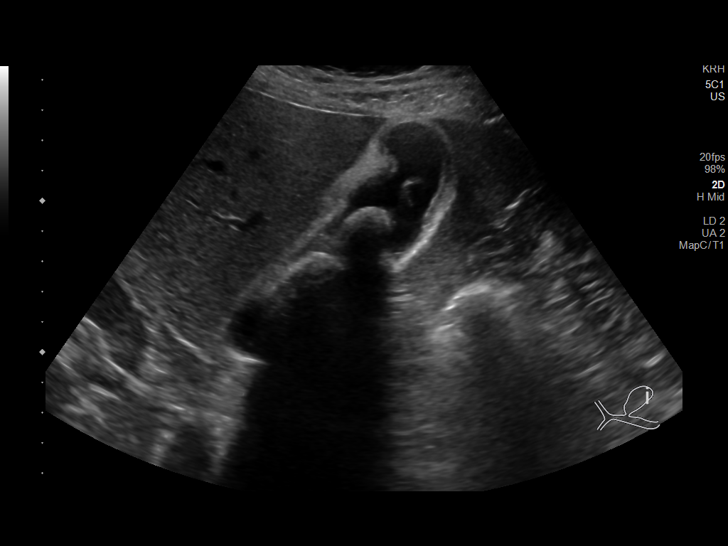
[im 22/85]
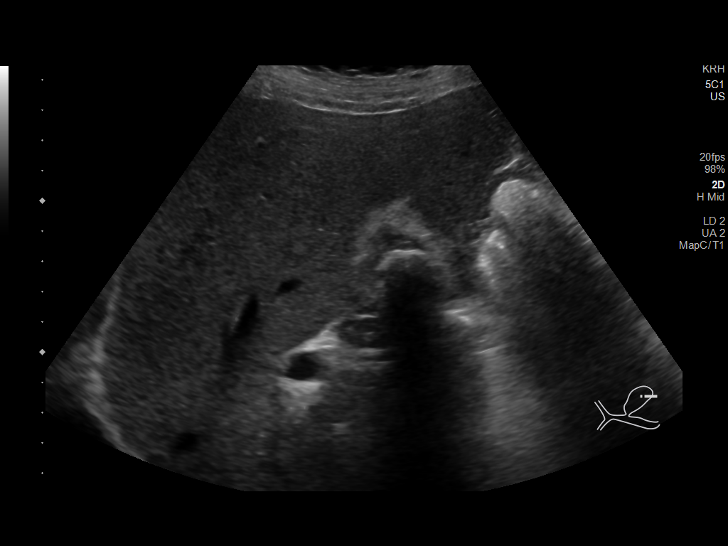
[im 29/85]
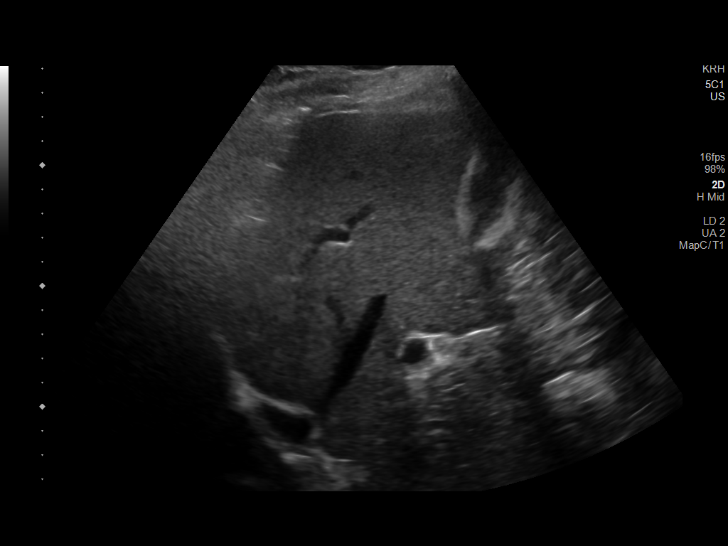
[im 36/85]
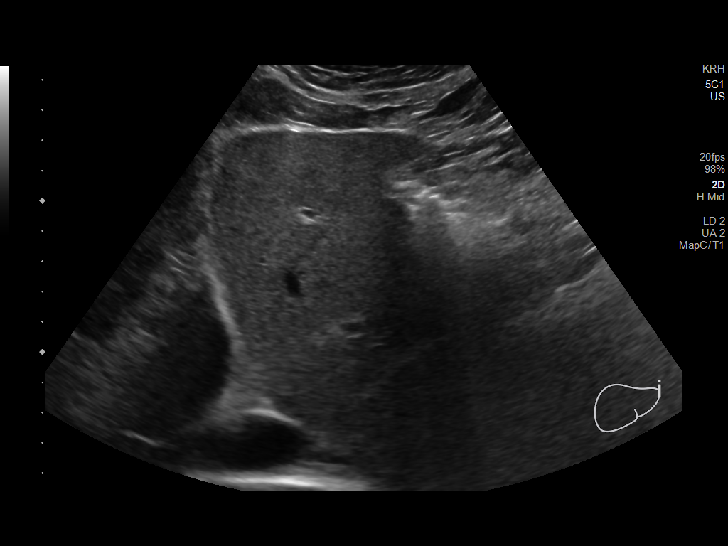
[im 43/85]
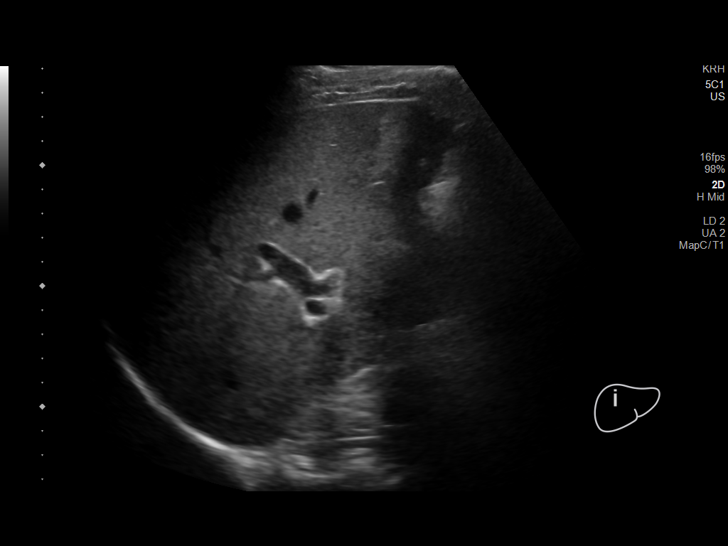
[im 50/85]
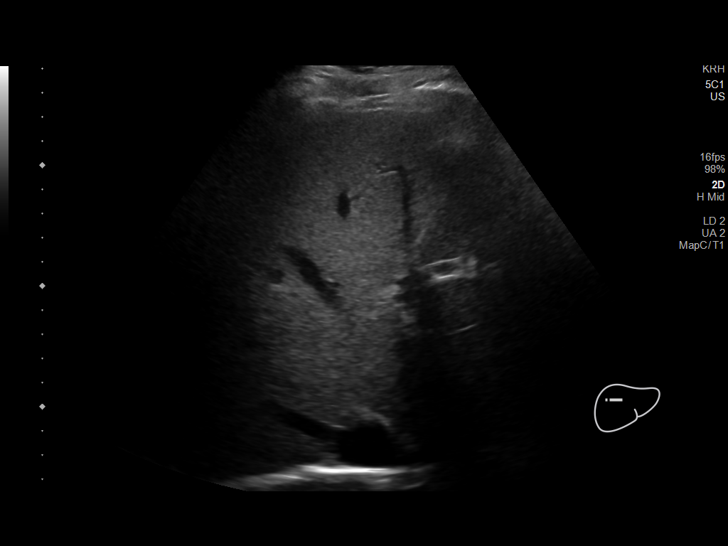
[im 57/85]
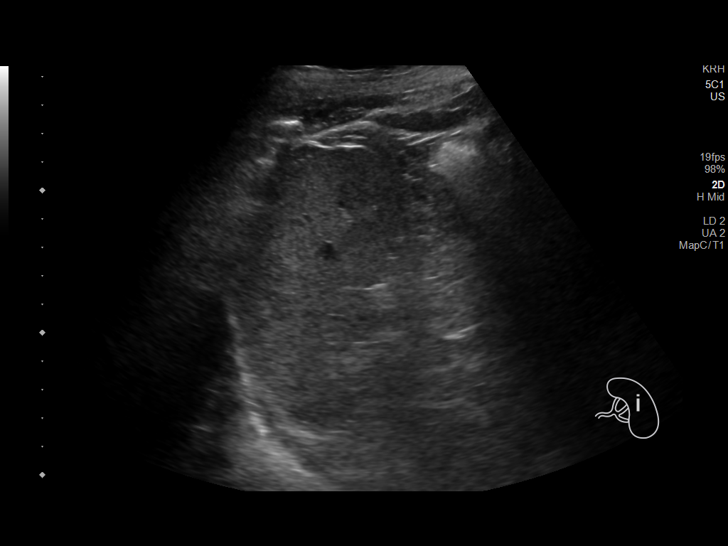
[im 64/85]
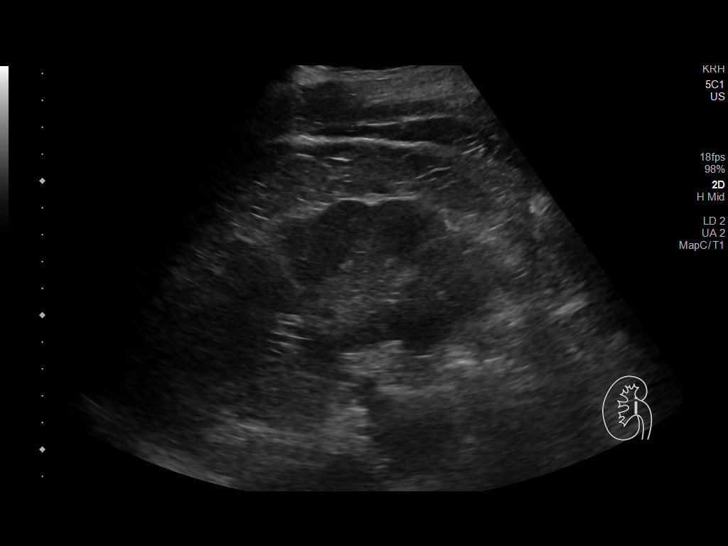
[im 71/85]
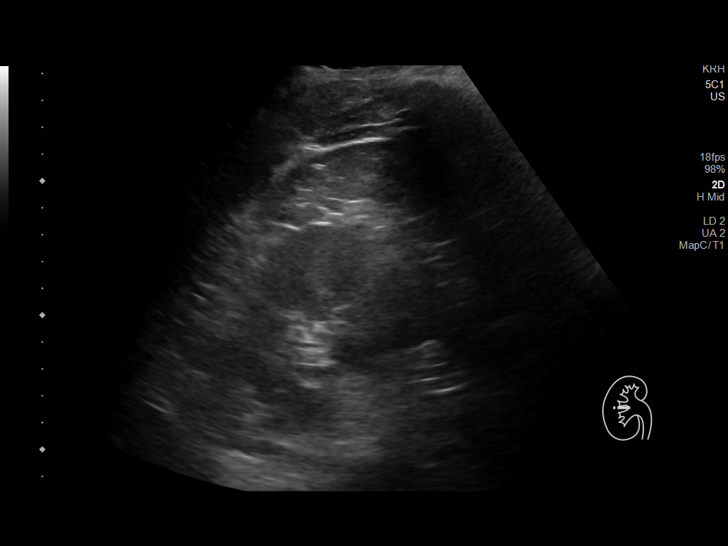
[im 78/85]
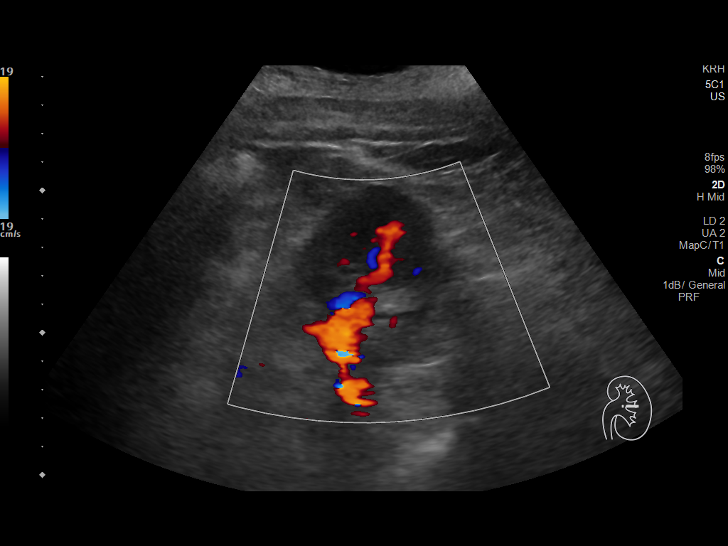
[im 85/85]
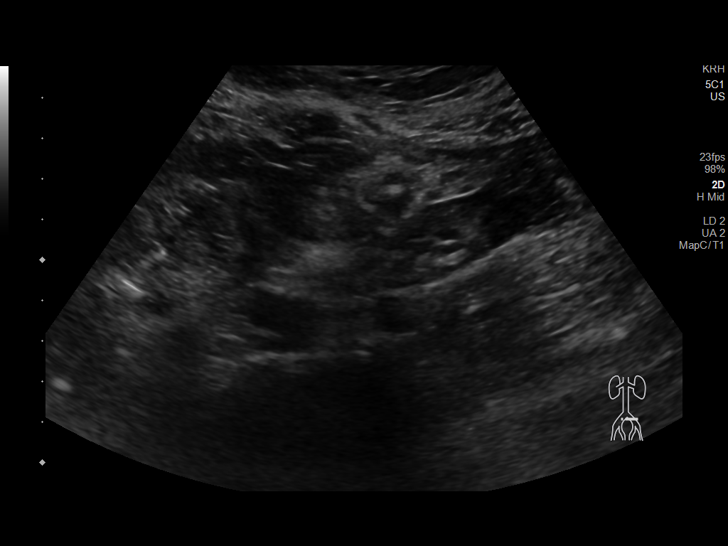

[13 of 25 positions shown; findings below may reference images not displayed]

FINDINGS: Gallbladder: Multiple large stone seen in the gallbladder. The
gallbladder wall measures 3.3 mm which is mildly thickened. Is also
small amount of gallbladder sludge. No Murphy's sign or
pericholecystic fluid.

Common bile duct: Diameter: 3 mm

Liver: No focal lesion identified. Within normal limits in
parenchymal echogenicity. Portal vein is patent on color Doppler
imaging with normal direction of blood flow towards the liver.

IVC: No abnormality visualized.

Pancreas: Visualized portion unremarkable.

Spleen: Size and appearance within normal limits.

Right Kidney: Length: 12.6 cm. Echogenicity within normal limits. No
mass or hydronephrosis visualized.

Left Kidney: Length: 12.6 cm. Echogenicity within normal limits. No
mass or hydronephrosis visualized.

Abdominal aorta: No aneurysm visualized.

Other findings: None.
IMPRESSION: 1. Cholelithiasis and mild gallbladder wall thickening without
pericholecystic fluid or Murphy's sign. A HIDA scan could further
evaluate if there is continued clinical concern.
2. The study is somewhat limited due to shadowing bowel gas. No
other abnormalities are noted.
